# Patient Record
Sex: Male | Born: 1985 | Race: White | Hispanic: No | Marital: Married | Smoking: Never smoker
Health system: Southern US, Community
[De-identification: ages and names within clinical notes are randomized; demographics above are authoritative.]

## PROBLEM LIST (undated history)

## (undated) ENCOUNTER — Emergency Department (HOSPITAL_COMMUNITY): Admission: EM | Payer: Self-pay | Source: Home / Self Care

## (undated) DIAGNOSIS — G589 Mononeuropathy, unspecified: Secondary | ICD-10-CM

## (undated) DIAGNOSIS — E119 Type 2 diabetes mellitus without complications: Secondary | ICD-10-CM

## (undated) HISTORY — DX: Type 2 diabetes mellitus without complications: E11.9

---

## 2005-08-27 ENCOUNTER — Emergency Department (HOSPITAL_COMMUNITY): Admission: EM | Admit: 2005-08-27 | Discharge: 2005-08-27 | Payer: Self-pay | Admitting: Family Medicine

## 2012-10-20 ENCOUNTER — Emergency Department (HOSPITAL_COMMUNITY): Payer: No Typology Code available for payment source

## 2012-10-20 ENCOUNTER — Encounter (HOSPITAL_COMMUNITY): Payer: Self-pay

## 2012-10-20 ENCOUNTER — Emergency Department (HOSPITAL_COMMUNITY)
Admission: EM | Admit: 2012-10-20 | Discharge: 2012-10-20 | Disposition: A | Discharge status: Home / Self Care | Payer: No Typology Code available for payment source | Attending: Emergency Medicine | Admitting: Emergency Medicine

## 2012-10-20 DIAGNOSIS — R51 Headache: Secondary | ICD-10-CM | POA: Insufficient documentation

## 2012-10-20 DIAGNOSIS — Y9389 Activity, other specified: Secondary | ICD-10-CM | POA: Insufficient documentation

## 2012-10-20 DIAGNOSIS — R110 Nausea: Secondary | ICD-10-CM | POA: Insufficient documentation

## 2012-10-20 DIAGNOSIS — S199XXA Unspecified injury of neck, initial encounter: Secondary | ICD-10-CM | POA: Insufficient documentation

## 2012-10-20 DIAGNOSIS — Y92410 Unspecified street and highway as the place of occurrence of the external cause: Secondary | ICD-10-CM | POA: Insufficient documentation

## 2012-10-20 DIAGNOSIS — S0993XA Unspecified injury of face, initial encounter: Secondary | ICD-10-CM | POA: Insufficient documentation

## 2012-10-20 MED ORDER — HYDROCODONE-ACETAMINOPHEN 5-325 MG PO TABS: 1.0000 | ORAL_TABLET | Freq: Four times a day (QID) | ORAL | Status: DC | PRN

## 2012-10-20 MED ORDER — CYCLOBENZAPRINE HCL 10 MG PO TABS: 10.0000 mg | ORAL_TABLET | Freq: Three times a day (TID) | ORAL | Status: DC | PRN
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
## 2012-10-20 NOTE — ED Notes (Signed)
C-Collar applied

## 2012-10-21 NOTE — ED Provider Notes (Signed)
Medical screening examination/treatment/procedure(s) were performed by non-physician practitioner and as supervising physician I was immediately available for consultation/collaboration.   Vaishnavi Dalby III, MD 10/21/12 1234 

## 2013-10-19 ENCOUNTER — Emergency Department (HOSPITAL_COMMUNITY)
Admission: EM | Admit: 2013-10-19 | Discharge: 2013-10-19 | Disposition: A | Discharge status: Home / Self Care | Payer: Self-pay | Attending: Emergency Medicine | Admitting: Emergency Medicine

## 2013-10-19 ENCOUNTER — Emergency Department (HOSPITAL_COMMUNITY): Payer: Self-pay

## 2013-10-19 DIAGNOSIS — R209 Unspecified disturbances of skin sensation: Secondary | ICD-10-CM | POA: Insufficient documentation

## 2013-10-19 DIAGNOSIS — R0602 Shortness of breath: Secondary | ICD-10-CM | POA: Insufficient documentation

## 2013-10-19 DIAGNOSIS — R0789 Other chest pain: Secondary | ICD-10-CM | POA: Insufficient documentation

## 2013-10-19 LAB — BASIC METABOLIC PANEL
BUN: 16 mg/dL (ref 6–23)
CO2: 22 mEq/L (ref 19–32)
CREATININE: 1.28 mg/dL (ref 0.50–1.35)
Calcium: 9.2 mg/dL (ref 8.4–10.5)
Chloride: 102 mEq/L (ref 96–112)
GFR, EST AFRICAN AMERICAN: 86 mL/min — AB (ref >90)
GFR, EST NON AFRICAN AMERICAN: 74 mL/min — AB (ref >90)
Glucose, Bld: 119 mg/dL — ABNORMAL HIGH (ref 70–99)
Potassium: 3.7 mEq/L (ref 3.7–5.3)
Sodium: 139 mEq/L (ref 137–147)

## 2013-10-19 LAB — CBC
HCT: 43.5 % (ref 39.0–52.0)
Hemoglobin: 15.5 g/dL (ref 13.0–17.0)
MCH: 31 pg (ref 26.0–34.0)
MCHC: 35.6 g/dL (ref 30.0–36.0)
MCV: 87 fL (ref 78.0–100.0)
Platelets: 224 10*3/uL (ref 150–400)
RBC: 5 MIL/uL (ref 4.22–5.81)
RDW: 12.7 % (ref 11.5–15.5)
WBC: 8.8 10*3/uL (ref 4.0–10.5)

## 2013-10-19 LAB — I-STAT TROPONIN, ED: Troponin i, poc: 0 ng/mL (ref 0.00–0.08)

## 2013-10-19 MED ORDER — ASPIRIN 81 MG PO CHEW
324.0000 mg | CHEWABLE_TABLET | Freq: Once | ORAL | Status: AC
  Administered 2013-10-19: 324 mg via ORAL
  Filled 2013-10-19: qty 4

## 2013-10-19 MED ORDER — NAPROXEN 500 MG PO TABS: 500.0000 mg | ORAL_TABLET | Freq: Two times a day (BID) | ORAL | Status: DC

## 2013-10-19 NOTE — ED Provider Notes (Signed)
Medical screening examination/treatment/procedure(s) were conducted as a shared visit with non-physician practitioner(s) or resident  and myself.  I personally evaluated the patient during the encounter and agree with the findings and plan unless otherwise indicated.    I have personally reviewed any xrays and/ or EKG's with the provider and I agree with interpretation.   28 year old male with no significant medical history, very low risk cardiac, very low risk PE. Atypical symptoms intermittent for months. Patient well appearing on exam, no chest pain currently, lungs clear, regular rate and rhythm. EKG revealed no acute findings. Discussed close followup outpatient with primary care physician, patient may need stress test if symptoms continue.    EKG Interpretation   Date/Time:  Sunday October 19 2013 03:33:36 EDT Ventricular Rate:  80 PR Interval:  162 QRS Duration: 92 QT Interval:  363 QTC Calculation: 419 R Axis:   19 Text Interpretation:  Sinus rhythm Confirmed by Jodi MourningZAVITZ  MD, Nikolaos Maddocks ([id number])  on 10/19/2013 5:16:40 AM       Chest pain atypical  Enid SkeensJoshua M Nashalie Sallis, MD 10/19/13 [phone]

## 2013-10-19 NOTE — ED Notes (Signed)
Lt. Sided cp intermittently for 4 days. States, "been going on for a while."

## 2013-10-19 NOTE — ED Provider Notes (Signed)
CSN: [id number]     Arrival date & time 10/19/13  [id number] History   First MD Initiated Contact with Patient 10/19/13 [id number]     Chief Complaint  Patient presents with  . Chest Pain     (Consider location/radiation/quality/duration/timing/severity/associated sxs/prior Treatment) HPI Comments: Patient is a 28 year old male who presents to the emergency department for chest pain. Patient states that he has been experiencing a dull left-sided chest pain constantly for the past year. He states that chest pain has intermittently worsened over the past 4 days with most of his worsening in symptoms occurring since 7 PM yesterday evening. Patient describes the pain since this time to be pressure-like and burning. He states that the pain intermittently radiates to his left shoulder. He states that he has some associated tingling in his left arm, but that it has been "tingling for many days". Patient did not take anything for her symptoms prior to arrival. He states that onset was while at rest after eating dinner. Symptoms associated with SOB. Patient denies associated fever, syncope, lightheadedness, cough or hemoptysis, vomiting, abdominal, extremity numbness, or weakness. Patient denies a history of ACS, diabetes, hypertension, hyperlipidemia, DVT/PE, and coagulopathies. He denies a family history of ACS. He denies recent trauma or injury to the area, surgeries or hospitalizations, and prolonged travel. Patient gave 324mg  ASA on arrival with some improvement in chest pain.  Patient is a 28 y.o. male presenting with chest pain. The history is provided by the patient. No language interpreter was used.  Chest Pain Associated symptoms: shortness of breath   Associated symptoms: no palpitations     No past medical history on file. No past surgical history on file. No family history on file. History  Substance Use Topics  . Smoking status: Never Smoker   . Smokeless tobacco: Current User    Types: Snuff  .  Alcohol Use: Yes    Review of Systems  Respiratory: Positive for shortness of breath.   Cardiovascular: Positive for chest pain. Negative for palpitations.  Neurological:       +tingling LUE  All other systems reviewed and are negative.      Allergies  Review of patient's allergies indicates no known allergies.  Home Medications   Current Outpatient Rx  Name  Route  Sig  Dispense  Refill  . cyclobenzaprine (FLEXERIL) 10 MG tablet   Oral   Take 1 tablet (10 mg total) by mouth 3 (three) times daily as needed for muscle spasms.   30 tablet   0   . HYDROcodone-acetaminophen (NORCO/VICODIN) 5-325 MG per tablet   Oral   Take 1 tablet by mouth every 6 (six) hours as needed for pain.   15 tablet   0    BP 142/88  Pulse 79  Temp(Src) 98.7 F (37.1 C) (Oral)  Resp 20  Ht 5\' 7"  (1.702 m)  Wt 275 lb (124.739 kg)  BMI 43.06 kg/m2  SpO2 100%  Physical Exam  Nursing note and vitals reviewed. Constitutional: He is oriented to person, place, and time. He appears well-developed and well-nourished. No distress.  HENT:  Head: Normocephalic and atraumatic.  Mouth/Throat: Oropharynx is clear and moist. No oropharyngeal exudate.  Eyes: Conjunctivae and EOM are normal. No scleral icterus.  Neck: Normal range of motion. Neck supple.  Cardiovascular: Normal rate, regular rhythm, normal heart sounds and intact distal pulses.   No JVD  Pulmonary/Chest: Effort normal and breath sounds normal. No respiratory distress. He has no wheezes. He has no rales.  He exhibits tenderness.  L upper chest, medial to L shoulder. No tachypnea, retractions, or accessory muscle use. No stridor.  Abdominal: Soft. He exhibits no distension. There is no tenderness. There is no rebound and no guarding.  Musculoskeletal: Normal range of motion.  Neurological: He is alert and oriented to person, place, and time.  GCS 15. Speech is goal oriented. Patient moves extremities without ataxia.  Skin: Skin is warm and  dry. No rash noted. He is not diaphoretic. No erythema. No pallor.  Psychiatric: He has a normal mood and affect. His behavior is normal.    ED Course  Procedures (including critical care time) Labs Review Labs Reviewed  BASIC METABOLIC PANEL - Abnormal; Notable for the following:    Glucose, Bld 119 (*)    GFR calc non Af Amer 74 (*)    GFR calc Af Amer 86 (*)    All other components within normal limits  CBC  I-STAT TROPOININ, ED   Imaging Review Dg Chest 2 View  10/19/2013   CLINICAL DATA:  Shortness of breath.  EXAM: CHEST  2 VIEW  COMPARISON:  None.  FINDINGS: Normal heart size and mediastinal contours. No acute infiltrate or edema. No effusion or pneumothorax. No acute osseous findings.  IMPRESSION: No active cardiopulmonary disease.   Electronically Signed   By: Tiburcio Pea M.D.   On: 10/19/2013 04:37     EKG Interpretation   Date/Time:  Sunday October 19 2013 03:33:36 EDT Ventricular Rate:  80 PR Interval:  162 QRS Duration: 92 QT Interval:  363 QTC Calculation: 419 R Axis:   19 Text Interpretation:  Sinus rhythm Confirmed by ZAVITZ  MD, JOSHUA ([id number])  on 10/19/2013 5:16:40 AM      MDM   Final diagnoses:  Chest pain, atypical   28 year old male with no significant past medical history presents for chest pain. Patient states he has had chest pain constantly over the past year this has been worsening over the last 4 days. Most recent worsening of symptoms began at 7 PM yesterday. Patient well and nontoxic appearing, hemodynamically stable, and afebrile. Physical exam unremarkable today. Cardiac workup negative. That ACS given atypical nature of symptoms, chronicity of pain, and reassuring workup today. Patient also has a Heart Score of 1 c/w low risk of MACE. Doubt PE given lack of tachycardia, tachypnea, dyspnea, and hypoxia. Patient is PERC negative.   Do not believe further emergent workup is indicated at this time. Patient stable and appropriate for discharge  with cardiology followup for further evaluation of symptoms and scheduling of outpatient stress test. Resource guide provided as well as a prescription for naproxen. Return precautions discussed and patient agreeable to plan with no unaddressed concerns.    Filed Vitals:   10/19/13 0338  BP: 142/88  Pulse: 79  Temp: 98.7 F (37.1 C)  TempSrc: Oral  Resp: 20  Height: 5\' 7"  (1.702 m)  Weight: 275 lb (124.739 kg)  SpO2: 100%       Antony Madura, PA-C 10/19/13 [phone]

## 2013-10-19 NOTE — ED Notes (Signed)
Chest pain resolved

## 2013-10-19 NOTE — Discharge Instructions (Signed)
Take Naproxen as prescribed for pain. Follow up with cardiology to discuss your symptoms and to schedule an outpatient stress test. Follow up with a primary care provider; refer to the resource guide below if you do not have a primary doctor. Return if symptoms worsen.  Chest Pain (Nonspecific) It is often hard to give a specific diagnosis for the cause of chest pain. There is always a chance that your pain could be related to something serious, such as a heart attack or a blood clot in the lungs. You need to follow up with your caregiver for further evaluation. CAUSES   Heartburn.  Pneumonia or bronchitis.  Anxiety or stress.  Inflammation around your heart (pericarditis) or lung (pleuritis or pleurisy).  A blood clot in the lung.  A collapsed lung (pneumothorax). It can develop suddenly on its own (spontaneous pneumothorax) or from injury (trauma) to the chest.  Shingles infection (herpes zoster virus). The chest wall is composed of bones, muscles, and cartilage. Any of these can be the source of the pain.  The bones can be bruised by injury.  The muscles or cartilage can be strained by coughing or overwork.  The cartilage can be affected by inflammation and become sore (costochondritis). DIAGNOSIS  Lab tests or other studies, such as X-rays, electrocardiography, stress testing, or cardiac imaging, may be needed to find the cause of your pain.  TREATMENT   Treatment depends on what may be causing your chest pain. Treatment may include:  Acid blockers for heartburn.  Anti-inflammatory medicine.  Pain medicine for inflammatory conditions.  Antibiotics if an infection is present.  You may be advised to change lifestyle habits. This includes stopping smoking and avoiding alcohol, caffeine, and chocolate.  You may be advised to keep your head raised (elevated) when sleeping. This reduces the chance of acid going backward from your stomach into your esophagus.  Most of the  time, nonspecific chest pain will improve within 2 to 3 days with rest and mild pain medicine. HOME CARE INSTRUCTIONS   If antibiotics were prescribed, take your antibiotics as directed. Finish them even if you start to feel better.  For the next few days, avoid physical activities that bring on chest pain. Continue physical activities as directed.  Do not smoke.  Avoid drinking alcohol.  Only take over-the-counter or prescription medicine for pain, discomfort, or fever as directed by your caregiver.  Follow your caregiver's suggestions for further testing if your chest pain does not go away.  Keep any follow-up appointments you made. If you do not go to an appointment, you could develop lasting (chronic) problems with pain. If there is any problem keeping an appointment, you must call to reschedule. SEEK MEDICAL CARE IF:   You think you are having problems from the medicine you are taking. Read your medicine instructions carefully.  Your chest pain does not go away, even after treatment.  You develop a rash with blisters on your chest. SEEK IMMEDIATE MEDICAL CARE IF:   You have increased chest pain or pain that spreads to your arm, neck, jaw, back, or abdomen.  You develop shortness of breath, an increasing cough, or you are coughing up blood.  You have severe back or abdominal pain, feel nauseous, or vomit.  You develop severe weakness, fainting, or chills.  You have a fever. THIS IS AN EMERGENCY. Do not wait to see if the pain will go away. Get medical help at once. Call your local emergency services (911 in U.S.). Do not  drive yourself to the hospital. MAKE SURE YOU:   Understand these instructions.  Will watch your condition.  Will get help right away if you are not doing well or get worse. Document Released: 04/19/2005 Document Revised: 10/02/2011 Document Reviewed: 02/13/2008 [facility] Patient Information 2014 [place], [place].  Emergency Department Resource  Guide 1) Find a Doctor and Pay Out of Pocket Although you won't have to find out who is covered by your insurance plan, it is a good idea to ask around and get recommendations. You will then need to call the office and see if the doctor you have chosen will accept you as a new patient and what types of options they offer for patients who are self-pay. Some doctors offer discounts or will set up payment plans for their patients who do not have insurance, but you will need to ask so you aren't surprised when you get to your appointment.  2) Contact Your Local Health Department Not all health departments have doctors that can see patients for sick visits, but many do, so it is worth a call to see if yours does. If you don't know where your local health department is, you can check in your phone book. The CDC also has a tool to help you locate your state's health department, and many state websites also have listings of all of their local health departments.  3) Find a Walk-in Clinic If your illness is not likely to be very severe or complicated, you may want to try a walk in clinic. These are popping up all over the country in pharmacies, drugstores, and shopping centers. They're usually staffed by nurse practitioners or physician assistants that have been trained to treat common illnesses and complaints. They're usually fairly quick and inexpensive. However, if you have serious medical issues or chronic medical problems, these are probably not your best option.  No Primary Care Doctor: - Call Health Connect at  [phone] - they can help you locate a primary care doctor that  accepts your insurance, provides certain services, etc. - Physician Referral Service- [phone]  Chronic Pain Problems: Organization         Address  Phone   Notes  [facility]  [phone] Patients need to be referred by their primary care doctor.   Medication Assistance: Organization          Address  Phone   Notes  [facility] Medication [facility] [address] [place] [place], [place] [address] [phone] --Must be a resident of [facility] -- Must have NO insurance coverage whatsoever (no Medicaid/ Medicare, etc.) -- The pt. MUST have a primary care doctor that directs their care regularly and follows them in the community   Med[facility]ist  [phone]   [employer]  [phone]    Agencies that provide inexpensive medical care: Organization         Address  Phone   Notes  [facility]  [phone]   [facility]    [phone]   [facility] [address] [place], [place] [address] [phone]   [facility] [address] [place], [place] [phone]   Planned Parenthood    [phone]   [facility]    [phone]   Community [facility] and [facility]  [address], [place] Phone:  [phone], Fax:  [phone] Hours of Operation:  9 am -  6 pm, M-F.  Also accepts Medicaid/Medicare and self-pay.  [facility]  [address], [place] Phone: [phone](336) [phone], Fax: [phone]. Hours of Operation:  8:30 am - 5:30 pm, M-F.  Also accepts Medicaid and self-pay.  [facility] [place] [address], [place] Phone: [phone](336) [phone]   [facility] [address], [place], [place] [phone], Ext. [phone] Mondays & Thursdays: 7-9 AM.  First 15 patients are seen on a first come, first serve basis.    Medicaid-accepting [facility] Providers:  Organization         Address  Phone   Notes  [facility] [address], [place] [phone](336) [phone] Also accepts self-pay patients.  [facility] [address] [place], [place]  [phone]) [phone]   [facility] [address], [place] [phone](336) [phone]   [facility] [address] [place] [address], [place] [phone]) [phone]   Renaye RakersVeita Bland [address], [place]   [phone]) [phone] Only accepts [place] Access [place] patients after they have their name applied to their card.   Self-Pay (no insurance) in [facility]:  Organization         Address  Phone   Notes  Sickle Cell Patients, [facility] [address], [place] [phone](336) [phone]   [facility] [address], [place] [phone]) [phone]   [facility]  [address], [place] [phone]) [phone]   [facility]/Dr. Osei-Bonsu  [address] [place] [address], [place] or [phone], [place] [phone]) [phone] Phone number for both [place] and [place] locations is the same.  [facility] [address], [place] [phone]) [phone]   [facility] [address] [place] [address], [place] or [address] [phone]   [facility] [address], [place] [phone](336) [phone], phone; [phone](336) [phone], fax Sees patients 1st and 3rd Saturday of every month.  Must not qualify for public or private insurance (i.e. Medicaid, Medicare, [place] Health Choice, Veterans' Benefits)  Household income should be no more than 200% of the poverty level The clinic cannot treat you if you are pregnant or think you are pregnant  Sexually transmitted diseases are not treated at the clinic.    Dental Care: Organization         Address  Phone  Notes  [facility] [address], [place] [phone] Accepts children up to age 30 who are enrolled in [place] or [place] Health Choice; pregnant women with a Medicaid card; and children who have applied for Medicaid or [place] Health Choice, but were declined, whose parents can pay a reduced fee at time of service.  [facility] of [facility]  [place]  [address], [place] [phone]) [phone] Accepts children up to age 30 who are enrolled in [place] or [place] Health Choice; pregnant women with a Medicaid card; and children who have applied for Medicaid or [place] Health Choice, but were declined, whose parents can pay a reduced fee at time of service.  [facility]  [address], [place] [phone] Patients are seen by appointment only. Walk-ins are not accepted. [facility] will see patients 30 years of age and  older. Monday - Tuesday (8am-5pm) Most Wednesdays (8:30-5pm) $30 per visit, cash only  [facility] [employer] PROGRAM  [address], [facility] [phone] Patients are seen by appointment only. Walk-ins are not accepted. [facility] will see patients 63 years of age and older. One Wednesday Evening (Monthly: Volunteer Based).  $30 per visit, cash only  [employer]  [phone] for adults; Children under age 36, call Graduate Pediatric Dentistry at [phone]. Children aged 45-14, please call [phone] to request a pediatric application.  Dental services are provided in all areas of dental care including fillings, crowns and bridges, complete and partial dentures, implants, gum treatment, root canals, and extractions. Preventive care is also provided. Treatment is provided to both adults and children. Patients are selected via a lottery and there is often a waiting list.   [facility] [address] [place]  [phone] [email or web address]   [facility] [address] [place], [place] [phone], Ext. [phone] Second and Fourth Thursday of each month, opens at 6:30 AM; Clinic ends at 9 AM.  Patients are seen on a first-come first-served basis, and a limited number are seen during each clinic.   [facility]  [address] [place], [place] [phone]   Eligibility Requirements You must  have lived in [place], [place], or [place] counties for at least the last three months.   You cannot be eligible for state or federal sponsored [place], including [employer], [place], or [employer].   You generally cannot be eligible for healthcare insurance through your employer.    How to apply: Eligibility screenings are held every Tuesday and Wednesday afternoon from 1:00 pm until 4:00 pm. You do not need an appointment for the interview!  [facility] [address] [place], [place] [phone]   [facility]  [phone]   [facility]  [phone]   [facility]  [phone]    Behavioral Health Resources in the Community: Intensive Outpatient Programs Organization         Address  Phone  Notes  [facility] [address], [place], [place] [phone]   [facility] Outpatient [address] [place], [place] [phone]   [facility] [address] [place], [place]  [phone]   [facility] [address]  [place], [place] [phone] or [phone]   Substance Abuse Resources Organization         Address  Phone  Notes  [facility] Drug Services  [phone]   [facility]  [phone]   [facility] [place]  [phone]   Floydene Flock  [phone]   Residential & Outpatient Substance Abuse Program  [phone]   Psychological Services Organization         Address  Phone  Notes  [facility]  [phone]   [facility]  [phone]   [facility] Mental Health [address] [place] [phone] or [phone]    Mobile Crisis Teams Organization         Address  Phone  Notes  [facility], Mobile Crisis Care Unit  [phone]   [facility]  [address] [place], [place] [phone]   [facility] [address] [place] [phone]    Self-Help/Support Groups Organization         Address  Phone             Notes  [facility] [place] - variety of support groups  [phone]- I7437963 Call for more information  Narcotics Anonymous (NA), [facility] [address], [employer] [place]  2 meetings at this location   Statistician         Address  Phone  Notes  [facility] [address],    [place]  [phone]   [facility]  [address], [place] [address], [place], [place] [phone]   [facility] [address] [place], [place] [phone] Admissions: 8am-3pm M-F  [facility] [address],    [place], [place] [phone]   [facility] [address] [place], [place], [place] [phone]   [facility] [address]  [place], [place] [phone]   [facility] - Intensive Outpatient [address], [place], [place] [phone]   [facility] [address] [place].,  [place], [place] [phone] or [phone]   [facility] ([facility]) [address], [place], [place] [phone] Accepts Medicaid  [facility] [address],  [place] [phone] Substance Abuse/Addiction Treatment   [facility] Organization         Address  Phone  Notes  [facility]  [phone]   Angie Fava, PhD [address] [place], [place]   [phone] or [phone]   [facility]   [address] [place], [place] [phone]   [facility] [address] [place], [place] [phone] Insurance/Medicaid/sponsorship through [facility] [address]                                    [place], [place] [phone] Therapy/tele-psych/case  [facility] [address], [place] [phone]    Dr. Lolly Mustache  [phone]   [facility]  [employer] [facility] Dept. 1) [address]  2) [address], [place] 3)  [address], [place] [phone]  [phone]   [facility] Child Abuse Hotline 660-071-0701 or [phone] (After Hours)

## 2013-10-19 NOTE — ED Notes (Signed)
Taken straight back for EKG

## 2014-09-24 ENCOUNTER — Emergency Department (HOSPITAL_COMMUNITY)
Admission: EM | Admit: 2014-09-24 | Discharge: 2014-09-24 | Disposition: A | Discharge status: Home / Self Care | Payer: Self-pay | Attending: Emergency Medicine | Admitting: Emergency Medicine

## 2014-09-24 ENCOUNTER — Emergency Department (HOSPITAL_COMMUNITY): Payer: Self-pay

## 2014-09-24 ENCOUNTER — Encounter (HOSPITAL_COMMUNITY): Payer: Self-pay | Admitting: *Deleted

## 2014-09-24 DIAGNOSIS — R059 Cough, unspecified: Secondary | ICD-10-CM

## 2014-09-24 DIAGNOSIS — Z791 Long term (current) use of non-steroidal anti-inflammatories (NSAID): Secondary | ICD-10-CM | POA: Insufficient documentation

## 2014-09-24 DIAGNOSIS — R05 Cough: Secondary | ICD-10-CM

## 2014-09-24 DIAGNOSIS — Z720 Tobacco use: Secondary | ICD-10-CM | POA: Insufficient documentation

## 2014-09-24 DIAGNOSIS — J40 Bronchitis, not specified as acute or chronic: Secondary | ICD-10-CM | POA: Insufficient documentation

## 2014-09-24 DIAGNOSIS — F419 Anxiety disorder, unspecified: Secondary | ICD-10-CM | POA: Insufficient documentation

## 2014-09-24 LAB — CBC WITH DIFFERENTIAL/PLATELET
Basophils Absolute: 0 10*3/uL (ref 0.0–0.1)
Basophils Relative: 0 % (ref 0–1)
EOS ABS: 0 10*3/uL (ref 0.0–0.7)
EOS PCT: 0 % (ref 0–5)
HCT: 45.2 % (ref 39.0–52.0)
Hemoglobin: 15.4 g/dL (ref 13.0–17.0)
LYMPHS ABS: 0.8 10*3/uL (ref 0.7–4.0)
Lymphocytes Relative: 11 % — ABNORMAL LOW (ref 12–46)
MCH: 30 pg (ref 26.0–34.0)
MCHC: 34.1 g/dL (ref 30.0–36.0)
MCV: 87.9 fL (ref 78.0–100.0)
MONOS PCT: 10 % (ref 3–12)
Monocytes Absolute: 0.7 10*3/uL (ref 0.1–1.0)
NEUTROS PCT: 79 % — AB (ref 43–77)
Neutro Abs: 5.3 10*3/uL (ref 1.7–7.7)
PLATELETS: 169 10*3/uL (ref 150–400)
RBC: 5.14 MIL/uL (ref 4.22–5.81)
RDW: 13.1 % (ref 11.5–15.5)
WBC: 6.8 10*3/uL (ref 4.0–10.5)

## 2014-09-24 LAB — BASIC METABOLIC PANEL
Anion gap: 8 (ref 5–15)
BUN: 11 mg/dL (ref 6–23)
CALCIUM: 8.9 mg/dL (ref 8.4–10.5)
CO2: 23 mmol/L (ref 19–32)
Chloride: 105 mmol/L (ref 96–112)
Creatinine, Ser: 1.14 mg/dL (ref 0.50–1.35)
GFR calc Af Amer: >90 mL/min (ref >90)
GFR, EST NON AFRICAN AMERICAN: 85 mL/min — AB (ref >90)
Glucose, Bld: 114 mg/dL — ABNORMAL HIGH (ref 70–99)
POTASSIUM: 3.4 mmol/L — AB (ref 3.5–5.1)
Sodium: 136 mmol/L (ref 135–145)

## 2014-09-24 MED ORDER — ONDANSETRON HCL 4 MG/2ML IJ SOLN
4.0000 mg | Freq: Once | INTRAMUSCULAR | Status: AC
  Administered 2014-09-24: 4 mg via INTRAVENOUS
  Filled 2014-09-24: qty 2

## 2014-09-24 MED ORDER — PREDNISONE 20 MG PO TABS: 40.0000 mg | ORAL_TABLET | Freq: Every day | ORAL | Status: DC

## 2014-09-24 MED ORDER — HYDROCOD POLST-CHLORPHEN POLST 10-8 MG/5ML PO LQCR
5.0000 mL | Freq: Once | ORAL | Status: AC
  Administered 2014-09-24: 5 mL via ORAL
  Filled 2014-09-24: qty 5

## 2014-09-24 MED ORDER — ALBUTEROL SULFATE HFA 108 (90 BASE) MCG/ACT IN AERS: 1.0000 | INHALATION_SPRAY | Freq: Four times a day (QID) | RESPIRATORY_TRACT | Status: DC | PRN

## 2014-09-24 MED ORDER — KETOROLAC TROMETHAMINE 30 MG/ML IJ SOLN
30.0000 mg | Freq: Once | INTRAMUSCULAR | Status: AC
  Administered 2014-09-24: 30 mg via INTRAVENOUS
  Filled 2014-09-24: qty 1

## 2014-09-24 MED ORDER — SODIUM CHLORIDE 0.9 % IV SOLN
INTRAVENOUS | Status: DC
  Administered 2014-09-24: 15:00:00 via INTRAVENOUS

## 2014-09-24 MED ORDER — ALBUTEROL SULFATE (2.5 MG/3ML) 0.083% IN NEBU
5.0000 mg | INHALATION_SOLUTION | Freq: Once | RESPIRATORY_TRACT | Status: AC
  Administered 2014-09-24: 5 mg via RESPIRATORY_TRACT
  Filled 2014-09-24: qty 6

## 2014-09-24 MED ORDER — IPRATROPIUM BROMIDE 0.02 % IN SOLN
0.5000 mg | Freq: Once | RESPIRATORY_TRACT | Status: AC
  Administered 2014-09-24: 0.5 mg via RESPIRATORY_TRACT
  Filled 2014-09-24: qty 2.5

## 2014-09-24 MED ORDER — HYDROCODONE-ACETAMINOPHEN 7.5-325 MG/15ML PO SOLN: 15.0000 mL | Freq: Four times a day (QID) | ORAL | Status: DC | PRN

## 2014-09-24 MED ORDER — SODIUM CHLORIDE 0.9 % IV BOLUS (SEPSIS)
1000.0000 mL | Freq: Once | INTRAVENOUS | Status: AC
  Administered 2014-09-24: 1000 mL via INTRAVENOUS

## 2014-09-24 MED ORDER — LORAZEPAM 2 MG/ML IJ SOLN
1.0000 mg | Freq: Once | INTRAMUSCULAR | Status: AC
  Administered 2014-09-24: 1 mg via INTRAVENOUS
  Filled 2014-09-24: qty 1

## 2014-09-24 NOTE — ED Provider Notes (Signed)
CSN: [id number]     Arrival date & time 09/24/14  1338 History   First MD Initiated Contact with Patient 09/24/14 1349     Chief Complaint  Patient presents with  . Shortness of Breath  . Cough  . Nasal Congestion     (Consider location/radiation/quality/duration/timing/severity/associated sxs/prior Treatment) HPI Comments: Patient here with cough and shortness of breath with wheezing along with fever 4 days. Positive exposure to influenza by his girlfriend. Has had diarrhea as well as nonbilious vomiting. Has been using over-the-counter medications without resolve. Denies any urinary symptoms. No CHF symptoms. No headache, neck pain, photophobia. Symptoms have been gradually worsening. Nothing makes them better  Patient is a 29 y.o. male presenting with shortness of breath and cough. The history is provided by the patient.  Shortness of Breath Associated symptoms: cough   Cough Associated symptoms: shortness of breath     History reviewed. No pertinent past medical history. History reviewed. No pertinent past surgical history. No family history on file. History  Substance Use Topics  . Smoking status: Current Some Day Smoker  . Smokeless tobacco: Current User    Types: Snuff  . Alcohol Use: Yes    Review of Systems  Respiratory: Positive for cough and shortness of breath.   All other systems reviewed and are negative.     Allergies  Review of patient's allergies indicates no known allergies.  Home Medications   Prior to Admission medications   Medication Sig Start Date End Date Taking? Authorizing Provider  naproxen (NAPROSYN) 500 MG tablet Take 1 tablet (500 mg total) by mouth 2 (two) times daily. 10/19/13   Antony Madura, PA-C   BP 116/98 mmHg  Pulse 119  Temp(Src) 99.2 F (37.3 C) (Oral)  Resp 23  SpO2 98% Physical Exam  Constitutional: He is oriented to person, place, and time. He appears well-developed and well-nourished.  Non-toxic appearance. No distress.   HENT:  Head: Normocephalic and atraumatic.  Eyes: Conjunctivae, EOM and lids are normal. Pupils are equal, round, and reactive to light.  Neck: Normal range of motion. Neck supple. No tracheal deviation present. No thyroid mass present.  Cardiovascular: Normal rate, regular rhythm and normal heart sounds.  Exam reveals no gallop.   No murmur heard. Pulmonary/Chest: Effort normal. No stridor. No respiratory distress. He has decreased breath sounds. He has wheezes. He has no rhonchi. He has no rales.  Abdominal: Soft. Normal appearance and bowel sounds are normal. He exhibits no distension. There is no tenderness. There is no rebound and no CVA tenderness.  Musculoskeletal: Normal range of motion. He exhibits no edema or tenderness.  Neurological: He is alert and oriented to person, place, and time. He has normal strength. No cranial nerve deficit or sensory deficit. GCS eye subscore is 4. GCS verbal subscore is 5. GCS motor subscore is 6.  Skin: Skin is warm and dry. No abrasion and no rash noted.  Psychiatric: His speech is normal and behavior is normal. His mood appears anxious.  Nursing note and vitals reviewed.   ED Course  Procedures (including critical care time) Labs Review Labs Reviewed  CBC WITH DIFFERENTIAL/PLATELET  BASIC METABOLIC PANEL    Imaging Review Dg Chest 2 View  09/24/2014   CLINICAL DATA:  Three-day history of shortness of breath and fever  EXAM: CHEST  2 VIEW  COMPARISON:  October 19, 2013  FINDINGS: Lungs are clear. Heart size and pulmonary vascularity are normal. No adenopathy. No pneumothorax. No bone lesions.  IMPRESSION: No  edema or consolidation.   Electronically Signed   By: Bretta BangWilliam  Woodruff III M.D.   On: 09/24/2014 14:04     EKG Interpretation None      MDM   Final diagnoses:  Cough    Patient given albuterol along with same as on Atrovent. He was very anxious and given Ativan as well as cough medication. Chest x-ray was negative for infiltrate.  He feels much better after the medications. Repeat lung exam shows good air movement. Patient likely with bronchitis. No concern for pulmonary embolism or ACS. Stable for discharge    Toy BakerAnthony T Maliki Gignac, MD 09/24/14 1531

## 2014-09-24 NOTE — Discharge Instructions (Signed)

## 2014-09-24 NOTE — ED Notes (Signed)
Patient complains of chest tightness with breath, cough, congestion and fever for 4 days. Patient is afebrile right now. Patient has history of panic and anxiety. He became very labored in breathing while being brought back. Patient is now calming down.

## 2014-09-24 NOTE — ED Notes (Signed)
Pt had a ride at DC.

## 2014-09-24 NOTE — ED Notes (Signed)
Dr. Freida BusmanAllen made aware of pt's HR at discharge.

## 2014-09-29 ENCOUNTER — Emergency Department (HOSPITAL_COMMUNITY): Payer: Self-pay

## 2014-09-29 ENCOUNTER — Encounter (HOSPITAL_COMMUNITY): Payer: Self-pay | Admitting: *Deleted

## 2014-09-29 ENCOUNTER — Emergency Department (HOSPITAL_COMMUNITY)
Admission: EM | Admit: 2014-09-29 | Discharge: 2014-09-29 | Disposition: A | Discharge status: Home / Self Care | Payer: Self-pay | Attending: Emergency Medicine | Admitting: Emergency Medicine

## 2014-09-29 DIAGNOSIS — R05 Cough: Secondary | ICD-10-CM | POA: Insufficient documentation

## 2014-09-29 DIAGNOSIS — Z720 Tobacco use: Secondary | ICD-10-CM | POA: Insufficient documentation

## 2014-09-29 DIAGNOSIS — K219 Gastro-esophageal reflux disease without esophagitis: Secondary | ICD-10-CM

## 2014-09-29 DIAGNOSIS — Z7952 Long term (current) use of systemic steroids: Secondary | ICD-10-CM | POA: Insufficient documentation

## 2014-09-29 DIAGNOSIS — R062 Wheezing: Secondary | ICD-10-CM | POA: Insufficient documentation

## 2014-09-29 DIAGNOSIS — R059 Cough, unspecified: Secondary | ICD-10-CM

## 2014-09-29 MED ORDER — ALBUTEROL SULFATE HFA 108 (90 BASE) MCG/ACT IN AERS: 2.0000 | INHALATION_SPRAY | RESPIRATORY_TRACT | Status: DC | PRN

## 2014-09-29 MED ORDER — OMEPRAZOLE 20 MG PO CPDR: 20.0000 mg | DELAYED_RELEASE_CAPSULE | Freq: Two times a day (BID) | ORAL | Status: DC

## 2014-09-29 MED ORDER — PREDNISONE 20 MG PO TABS
60.0000 mg | ORAL_TABLET | Freq: Once | ORAL | Status: AC
  Administered 2014-09-29: 60 mg via ORAL
  Filled 2014-09-29: qty 3

## 2014-09-29 MED ORDER — PANTOPRAZOLE SODIUM 40 MG PO TBEC
40.0000 mg | DELAYED_RELEASE_TABLET | Freq: Every day | ORAL | Status: DC
  Administered 2014-09-29: 40 mg via ORAL
  Filled 2014-09-29: qty 1

## 2014-09-29 MED ORDER — PREDNISONE 10 MG PO TABS: 10.0000 mg | ORAL_TABLET | Freq: Every day | ORAL | Status: DC

## 2014-09-29 MED ORDER — FAMOTIDINE IN NACL 20-0.9 MG/50ML-% IV SOLN
20.0000 mg | Freq: Once | INTRAVENOUS | Status: AC
  Administered 2014-09-29: 20 mg via INTRAVENOUS
  Filled 2014-09-29: qty 50

## 2014-09-29 MED ORDER — IPRATROPIUM-ALBUTEROL 0.5-2.5 (3) MG/3ML IN SOLN
3.0000 mL | Freq: Once | RESPIRATORY_TRACT | Status: AC
  Administered 2014-09-29: 3 mL via RESPIRATORY_TRACT
  Filled 2014-09-29: qty 3

## 2014-09-29 MED ORDER — AEROCHAMBER PLUS FLO-VU LARGE MISC
1.0000 | Freq: Once | Status: DC
  Filled 2014-09-29: qty 1

## 2014-09-29 NOTE — ED Provider Notes (Signed)
CSN: [id number]     Arrival date & time 09/29/14  1922 History  This chart was scribed for Earley Favor, NP working with Elwin Mocha, MD by Evon Slack, ED Scribe. This patient was seen in room WTR7/WTR7 and the patient's care was started at 9:13 PM.    Chief Complaint  Patient presents with  . Cough   The history is provided by the patient. No language interpreter was used.   HPI Comments: Mark Santos is a 29 y.o. male who presents to the Emergency Department complaining of progressively worsening chronic non productive cough onset 6 months prior. Pt states that the cough has recently worsened over the past week. Pt states that he has coughs up blood every morning. Pt states that he has tried cough medicine, prednisone and albuterol inhaler with no relief. Pt states that he is no longer a current everyday smoker but states that he smokes marijuana occasionally. Pt states that he does have a Hx of GERD. Pt states that he has a Hx of recurrent bronchitis. Pt denies Hx of asthma. Pt denies fever or other related symptoms.   History reviewed. No pertinent past medical history. History reviewed. No pertinent past surgical history. History reviewed. No pertinent family history. History  Substance Use Topics  . Smoking status: Current Some Day Smoker  . Smokeless tobacco: Current User    Types: Snuff  . Alcohol Use: Yes    Review of Systems  Constitutional: Negative for fever.  Respiratory: Positive for cough.   All other systems reviewed and are negative.   Allergies  Review of patient's allergies indicates no known allergies.  Home Medications   Prior to Admission medications   Medication Sig Start Date End Date Taking? Authorizing Provider  albuterol (PROVENTIL HFA;VENTOLIN HFA) 108 (90 BASE) MCG/ACT inhaler Inhale 1-2 puffs into the lungs every 6 (six) hours as needed for wheezing or shortness of breath. 09/24/14   Lorre Nick, MD  HYDROcodone-acetaminophen (HYCET) 7.5-325  mg/15 ml solution Take 15 mLs by mouth every 6 (six) hours as needed (Cough). 09/24/14 09/24/15  Lorre Nick, MD  naproxen (NAPROSYN) 500 MG tablet Take 1 tablet (500 mg total) by mouth 2 (two) times daily. Patient not taking: Reported on 09/24/2014 10/19/13   Antony Madura, PA-C  OVER THE COUNTER MEDICATION Patient states he uses his wife's inhaler when needed but can't remember the name of inhaler.    Historical Provider, MD  predniSONE (DELTASONE) 20 MG tablet Take 2 tablets (40 mg total) by mouth daily. 09/24/14   Lorre Nick, MD   BP 128/91 mmHg  Pulse 82  Temp(Src) 98.1 F (36.7 C) (Oral)  Resp 20  Ht  (1.676 m)  Wt 250 lb (113.399 kg)  BMI 40.37 kg/m2  SpO2 98%   Physical Exam  Constitutional: He appears well-developed and well-nourished.  HENT:  Head: Normocephalic.  Eyes: Pupils are equal, round, and reactive to light.  Neck: Normal range of motion.  Cardiovascular: Normal rate.   Pulmonary/Chest: Effort normal. He has wheezes. He has rales.  Musculoskeletal: Normal range of motion.  Neurological: He is alert.  Skin: Skin is warm.    ED Course  Procedures (including critical care time) DIAGNOSTIC STUDIES: Oxygen Saturation is 98% on RA, normal by my interpretation.    COORDINATION OF CARE: 9:35 PM-Discussed treatment plan with pt at bedside and pt agreed to plan.     Labs Review Labs Reviewed - No data to display  Imaging Review Dg Chest 2 View  09/29/2014   CLINICAL DATA:  7 days ago and dxd with bronchitis, was given steroids and cough medicine - pt states he has finished medication and feels no better - states he can feel rattling in his left chest - previous smoker - pt will not remove nipple rings,  EXAM: CHEST - 2 VIEW  COMPARISON:  None available  FINDINGS: Lungs are clear. Central peribronchial thickening as before. Heart size and mediastinal contours are within normal limits. No effusion. Visualized skeletal structures are unremarkable.  IMPRESSION: No acute  cardiopulmonary disease.   Electronically Signed   By: Corlis Leak  Hassell M.D.   On: 09/29/2014 20:04     EKG Interpretation None     It was given Pepcid IV, and albuterol, Atrovent nebulizer with little change in his symptoms.  He will be discharged home with a prescription for Prilosec 1 tablet twice a day for 7 days, then 1 tablet daily.  He also will be started on an additional steroid taper of 60 mg for 2 days, 50 mg for 2 days, 40 mg for 2 days, 30 mg for 2 days, 20 mg, 2 days and 10 mg for 2 days.  He will be given referral to GI as well as with our pulmonary for further evaluation MDM   Final diagnoses:  None           Earley FavorGail Bentley Haralson, NP 09/29/14 2314  Elwin MochaBlair Walden, MD 09/30/14 [phone]

## 2014-09-29 NOTE — ED Notes (Signed)
Pt reports he was seen at this facility on Thursday for cough/flu-like symptoms - pt reports persistent cough and states the cough is worse. Pt admits to non-productive cough however feels as though his chest is very congested. Denies any recent fevers. Cough has been present x6 months in duration.

## 2014-09-29 NOTE — Discharge Instructions (Signed)
Please make an appointment with both [facility] and [facility] Take the medication as directed

## 2016-03-21 ENCOUNTER — Emergency Department (HOSPITAL_COMMUNITY): Payer: Self-pay

## 2016-03-21 ENCOUNTER — Encounter (HOSPITAL_COMMUNITY): Payer: Self-pay

## 2016-03-21 ENCOUNTER — Emergency Department (HOSPITAL_COMMUNITY)
Admission: EM | Admit: 2016-03-21 | Discharge: 2016-03-21 | Disposition: A | Discharge status: Home / Self Care | Payer: Self-pay | Attending: Emergency Medicine | Admitting: Emergency Medicine

## 2016-03-21 DIAGNOSIS — Y999 Unspecified external cause status: Secondary | ICD-10-CM | POA: Insufficient documentation

## 2016-03-21 DIAGNOSIS — F17200 Nicotine dependence, unspecified, uncomplicated: Secondary | ICD-10-CM | POA: Insufficient documentation

## 2016-03-21 DIAGNOSIS — R200 Anesthesia of skin: Secondary | ICD-10-CM | POA: Insufficient documentation

## 2016-03-21 DIAGNOSIS — Y9389 Activity, other specified: Secondary | ICD-10-CM | POA: Insufficient documentation

## 2016-03-21 DIAGNOSIS — F17290 Nicotine dependence, other tobacco product, uncomplicated: Secondary | ICD-10-CM | POA: Insufficient documentation

## 2016-03-21 DIAGNOSIS — Z79899 Other long term (current) drug therapy: Secondary | ICD-10-CM | POA: Insufficient documentation

## 2016-03-21 DIAGNOSIS — X58XXXA Exposure to other specified factors, initial encounter: Secondary | ICD-10-CM | POA: Insufficient documentation

## 2016-03-21 DIAGNOSIS — S56911A Strain of unspecified muscles, fascia and tendons at forearm level, right arm, initial encounter: Secondary | ICD-10-CM | POA: Insufficient documentation

## 2016-03-21 DIAGNOSIS — Y92007 Garden or yard of unspecified non-institutional (private) residence as the place of occurrence of the external cause: Secondary | ICD-10-CM | POA: Insufficient documentation

## 2016-03-21 DIAGNOSIS — F1290 Cannabis use, unspecified, uncomplicated: Secondary | ICD-10-CM | POA: Insufficient documentation

## 2016-03-21 HISTORY — DX: Mononeuropathy, unspecified: G58.9

## 2016-03-21 MED ORDER — METHOCARBAMOL 500 MG PO TABS: 500.0000 mg | ORAL_TABLET | Freq: Two times a day (BID) | ORAL | 0 refills | Status: DC

## 2016-03-21 NOTE — ED Triage Notes (Addendum)
Pt c/o 5/10 right arm pain, weakness and numbness. Pt states he was using a Pick Axe recently at home. Pain reproducible upon palpation of right forearm. Pt presents w/ no neuro deficits. Pt denies HA. No slurred speech observed. Pt A+OX4, speaking in complete sentences.

## 2016-03-21 NOTE — ED Provider Notes (Signed)
[facility]-EMERGENCY DEPT Provider Note   CSN: [id number] Arrival date & time: 03/21/16  1930  By signing my name below, I, Javier Docker, attest that this documentation has been prepared under the direction and in the presence of Audry Pili, PA-C. Electronically Signed: Javier Docker, ER Scribe. 03/04/2016. 8:16 PM.   History   Chief Complaint Chief Complaint  Patient presents with  . Numbness    Right Arm    The history is provided by the patient. No language interpreter was used.    HPI Comments: Mark Santos is a 30 y.o. male who presents to the Emergency Department complaining of numbness and pain from the elbow to his hand for the last 10 days. His sx began while he was digging a hole with a pickax in his yard and hit a large rock. He has significant pain with gripping. When he wakes up in the morning his hand is completely numb, but resolves when he ices it. Able to hold objects without difficulty. He has been taking advil for pain. Denies pain currently. No other symptoms noted.   Past Medical History:  Diagnosis Date  . Pinched nerve    Pinched nerve in back    There are no active problems to display for this patient.   History reviewed. No pertinent surgical history.   Home Medications    Prior to Admission medications   Medication Sig Start Date End Date Taking? Authorizing Provider  albuterol (PROVENTIL HFA;VENTOLIN HFA) 108 (90 BASE) MCG/ACT inhaler Inhale 1-2 puffs into the lungs every 6 (six) hours as needed for wheezing or shortness of breath. 09/24/14   Lorre Nick, MD  omeprazole (PRILOSEC) 20 MG capsule Take 1 capsule (20 mg total) by mouth 2 (two) times daily before a meal. 09/29/14   Earley Favor, NP  predniSONE (DELTASONE) 10 MG tablet Take 1 tablet (10 mg total) by mouth daily. 09/29/14   Earley Favor, NP    Family History History reviewed. No pertinent family history.  Social History Social History  Substance Use Topics  . Smoking status: Current  Some Day Smoker  . Smokeless tobacco: Current User    Types: Snuff  . Alcohol use Yes   Allergies   Review of patient's allergies indicates no known allergies.   Review of Systems Review of Systems  Constitutional: Negative for chills and fever.  Musculoskeletal: Negative for neck pain and neck stiffness.  Neurological: Positive for weakness and numbness.    Physical Exam Updated Vital Signs BP 141/96 (BP Location: Right Arm)   Pulse 80   Temp 98.3 F (36.8 C)   Resp 16   Ht 5\' 7"  (1.702 m)   Wt 250 lb (113.4 kg)   SpO2 100%   BMI 39.16 kg/m   Physical Exam  Constitutional: He is oriented to person, place, and time. Vital signs are normal. He appears well-developed and well-nourished. No distress.  HENT:  Head: Normocephalic and atraumatic.  Right Ear: Hearing normal.  Left Ear: Hearing normal.  Eyes: Conjunctivae and EOM are normal. Pupils are equal, round, and reactive to light.  Neck: Normal range of motion. Neck supple.  Cardiovascular: Normal rate and regular rhythm.   Pulmonary/Chest: Effort normal. No respiratory distress.  Musculoskeletal: Normal range of motion.  Right Hand NVI. Motor/Sensation intact. Equal Grip Strengths.   Neurological: He is alert and oriented to person, place, and time. Coordination normal.  Skin: Skin is warm and dry. He is not diaphoretic.  Psychiatric: He has a normal mood  and affect. His speech is normal and behavior is normal. Thought content normal.  Nursing note and vitals reviewed.  ED Treatments / Results  DIAGNOSTIC STUDIES: Oxygen Saturation is 100% on RA, normal by my interpretation.    COORDINATION OF CARE: 8:16 PM Discussed treatment plan with pt at bedside which includes xrays and pt agreed to plan.  Labs (all labs ordered are listed, but only abnormal results are displayed) Labs Reviewed - No data to display  EKG  EKG Interpretation None      Radiology Dg Hand Complete Right  Result Date:  03/21/2016 CLINICAL DATA:  Pain in the second through fifth MCP joints for 1 week after using a PICC axis. Limited range of motion. Difficulty gripping items. EXAM: RIGHT HAND - COMPLETE 3+ VIEW COMPARISON:  None. FINDINGS: There is no evidence of fracture or dislocation. There is no evidence of arthropathy or other focal bone abnormality. Soft tissues are unremarkable. IMPRESSION: Negative. Electronically Signed   By: Burman NievesWilliam  Stevens M.D.   On: 03/21/2016 21:22    Procedures Procedures (including critical care time)  Medications Ordered in ED Medications - No data to display   Initial Impression / Assessment and Plan / ED Course  I have reviewed the triage vital signs and the nursing notes.  Pertinent labs & imaging results that were available during my care of the patient were reviewed by me and considered in my medical decision making (see chart for details).  Clinical Course    Final Clinical Impressions(s) / ED Diagnoses  I have reviewed and evaluated the relevant imaging studies.  I have reviewed the relevant previous healthcare records. I obtained HPI from historian.  ED Course:  Assessment: Pt is a 30yM who presents with right forearm pain s/p using pickaxe and striking rock x 1 week ago. Notes numbness occasionally. Full ROM of hand. Motor/sensation intact. On exam, pt in NAD. Nontoxic/nonseptic appearing. VSS. Afebrile. Imaging unremarkable with no acute abnormalities. Plan is to Rx Muscle relaxants. Counseled to reduce use of affected area. Possible nerve palsy from trauma by striking rock. At time of discharge, Patient is in no acute distress. Vital Signs are stable. Patient is able to ambulate. Patient able to tolerate PO.    Disposition/Plan:  DC Home Additional Verbal discharge instructions given and discussed with patient.  Pt Instructed to f/u with PCP in the next week for evaluation and treatment of symptoms. Return precautions given Pt acknowledges and agrees with  plan  Supervising Physician Benjiman CoreNathan Pickering, MD   Final diagnoses:  Forearm strain, right, initial encounter    New Prescriptions New Prescriptions   No medications on file     I personally performed the services described in this documentation, which was scribed in my presence. The recorded information has been reviewed and is accurate.       Audry Piliyler Josecarlos Harriott, PA-C 03/21/16 2132    Benjiman CoreNathan Pickering, MD 03/21/16 [phone]

## 2016-03-21 NOTE — Discharge Instructions (Signed)
Please read and follow all provided instructions.  Your diagnoses today include:  1. Forearm strain, right, initial encounter     Tests performed today include: Vital signs. See below for your results today.   Medications prescribed:  Take as prescribed   Home care instructions:  Follow any educational materials contained in this packet.  Follow-up instructions: Please follow-up [facility] for further evaluation of symptoms and treatment   Return instructions:  Please return to the Emergency Department if you do not get better, if you get worse, or new symptoms OR  - Fever (temperature greater than 101.48F)  - Bleeding that does not stop with holding pressure to the area    -Severe pain (please note that you may be more sore the day after your accident)  - Chest Pain  - Difficulty breathing  - Severe nausea or vomiting  - Inability to tolerate food and liquids  - Passing out  - Skin becoming red around your wounds  - Change in mental status (confusion or lethargy)  - New numbness or weakness    Please return if you have any other emergent concerns.  Additional Information:  Your vital signs today were: BP 141/96 (BP Location: Right Arm)    Pulse 80    Temp 98.3 F (36.8 C)    Resp 16    Ht 5\' 7"  (1.702 m)    Wt 113.4 kg    SpO2 100%    BMI 39.16 kg/m  If your blood pressure (BP) was elevated above 135/85 this visit, please have this repeated by your doctor within one month. ---------------

## 2016-06-09 ENCOUNTER — Encounter: Payer: Self-pay | Admitting: Medical

## 2016-07-21 ENCOUNTER — Encounter (HOSPITAL_COMMUNITY): Payer: Self-pay | Admitting: Emergency Medicine

## 2016-07-21 ENCOUNTER — Emergency Department (HOSPITAL_COMMUNITY)
Admission: EM | Admit: 2016-07-21 | Discharge: 2016-07-21 | Disposition: A | Discharge status: Home / Self Care | Payer: 59 | Attending: Emergency Medicine | Admitting: Emergency Medicine

## 2016-07-21 DIAGNOSIS — F17200 Nicotine dependence, unspecified, uncomplicated: Secondary | ICD-10-CM | POA: Insufficient documentation

## 2016-07-21 DIAGNOSIS — J029 Acute pharyngitis, unspecified: Secondary | ICD-10-CM | POA: Diagnosis present

## 2016-07-21 LAB — RAPID STREP SCREEN (MED CTR MEBANE ONLY): STREPTOCOCCUS, GROUP A SCREEN (DIRECT): NEGATIVE

## 2016-07-21 NOTE — ED Provider Notes (Signed)
[facility]-EMERGENCY DEPT Provider Note   CSN: [id number] Arrival date & time: 07/21/16  1212  By signing my name below, I, Alyssa GroveMartin Green, attest that this documentation has been prepared under the direction and in the presence of Roxy Horsemanobert Devarious Pavek, PA-C. Electronically Signed: Alyssa GroveMartin Green, ED Scribe. 07/21/16. 12:32 PM.   History   Chief Complaint Chief Complaint  Patient presents with  . Otalgia  . Sore Throat   The history is provided by the patient. No language interpreter was used.   HPI Comments: Mark Santos is a 30 y.o. male who presents to the Emergency Department complaining of gradual onset, constant, moderate left ear and throat pain onset this morning. He states he awoke this morning with both throat and ear pain. Pt reports associated fever (100.9). He took Ibuprofen with significant relief to symptoms. He states he has had strep throat many times in the past.  Past Medical History:  Diagnosis Date  . Pinched nerve    Pinched nerve in back    There are no active problems to display for this patient.   History reviewed. No pertinent surgical history.     Home Medications    Prior to Admission medications   Medication Sig Start Date End Date Taking? Authorizing Provider  albuterol (PROVENTIL HFA;VENTOLIN HFA) 108 (90 BASE) MCG/ACT inhaler Inhale 1-2 puffs into the lungs every 6 (six) hours as needed for wheezing or shortness of breath. 09/24/14   Lorre NickAnthony Allen, MD  methocarbamol (ROBAXIN) 500 MG tablet Take 1 tablet (500 mg total) by mouth 2 (two) times daily. 03/21/16   Audry Piliyler Mohr, PA-C  omeprazole (PRILOSEC) 20 MG capsule Take 1 capsule (20 mg total) by mouth 2 (two) times daily before a meal. 09/29/14   Earley FavorGail Schulz, NP  predniSONE (DELTASONE) 10 MG tablet Take 1 tablet (10 mg total) by mouth daily. 09/29/14   Earley FavorGail Schulz, NP    Family History History reviewed. No pertinent family history.  Social History Social History  Substance Use Topics  . Smoking status:  Current Some Day Smoker  . Smokeless tobacco: Current User    Types: Snuff  . Alcohol use Yes     Allergies   Patient has no known allergies.   Review of Systems Review of Systems  Constitutional: Positive for fever.  HENT: Positive for ear pain and sore throat.    Physical Exam Updated Vital Signs BP 133/82 (BP Location: Left Arm)   Pulse 71   Temp 98.4 F (36.9 C) (Oral)   Resp 16   SpO2 97%   Physical Exam Physical Exam  Constitutional: Pt  appears well-developed and well-nourished. No distress.  HENT:  Head: Normocephalic and atraumatic.  Right Ear: Tympanic membrane, external ear and ear canal normal.  Left Ear: Tympanic membrane, external ear and ear canal normal.  Nose: Nose normal. No mucosal edema or rhinorrhea.  Mouth/Throat: Oropharynx mildly erythematous, no abscess, no exudate Eyes: Conjunctivae are normal.  Neck: Normal range of motion, full passive range of motion without pain and phonation normal. No tracheal tenderness, no spinous process tenderness and no muscular tenderness present. No rigidity. No erythema and normal range of motion present. No Brudzinski's sign and no Kernig's sign noted.  Range of motion without pain  No midline or paraspinal tenderness Normal phonation No stridor Handling secretions without difficulty No nuchal rigidity or meningeal signs  Cardiovascular: Normal rate, regular rhythm and normal heart sounds.    Pulmonary/Chest: Effort normal and breath sounds normal. No stridor. No respiratory distress. No  decreased breath sounds. No wheezes.  Equal chest expansion, clear and equal breath sounds without focal wheezes, rhonchi or rales  Musculoskeletal: Normal range of motion.  Alert and oriented Moves all extremities without ataxia  Skin: Skin is warm and dry. Pt is not diaphoretic.  Psychiatric: Normal mood and affect.  Nursing note and vitals reviewed.    ED Treatments / Results  DIAGNOSTIC STUDIES: Oxygen Saturation is  97% on RA, normal by my interpretation.    COORDINATION OF CARE: 12:30 PM Discussed treatment plan with pt at bedside which includes rapid strep test and pt agreed to plan.  Labs (all labs ordered are listed, but only abnormal results are displayed) Labs Reviewed  RAPID STREP SCREEN (NOT [facility])    EKG  EKG Interpretation None       Radiology No results found.  Procedures Procedures (including critical care time)  Medications Ordered in ED Medications - No data to display   Initial Impression / Assessment and Plan / ED Course  I have reviewed the triage vital signs and the nursing notes.  Pertinent labs & imaging results that were available during my care of the patient were reviewed by me and considered in my medical decision making (see chart for details).  Clinical Course     Pt afebrile without tonsillar exudate, negative strep. Presents with mild cervical lymphadenopathy, & dysphagia; diagnosis of viral pharyngitis. No abx indicated. DC w symptomatic tx for pain  Pt does not appear dehydrated, but did discuss importance of water rehydration. Presentation non concerning for PTA or infxn spread to soft tissue. No trismus or uvula deviation. Specific return precautions discussed. Pt able to drink water in ED without difficulty with intact air way. Recommended PCP follow up.   Final Clinical Impressions(s) / ED Diagnoses   Final diagnoses:  Viral pharyngitis    New Prescriptions New Prescriptions   No medications on file   I personally performed the services described in this documentation, which was scribed in my presence. The recorded information has been reviewed and is accurate.      Roxy Horsemanobert Byrdie Miyazaki, PA-C 07/21/16 1331    Mancel BaleElliott Wentz, MD 07/21/16 [phone]

## 2016-07-21 NOTE — ED Notes (Signed)
Pt to ER by private vehicle with complaint of left ear and throat pain x3 days. Reports fever this morning of 100.9. NAD.

## 2016-07-21 NOTE — ED Notes (Signed)
Pt voices understanding of discharge instructions as well as follow up information. Pt given "Know Your Options" brochure with coupon card at discharge.

## 2016-07-24 LAB — CULTURE, GROUP A STREP (THRC)

## 2017-02-22 ENCOUNTER — Encounter (HOSPITAL_COMMUNITY): Payer: Self-pay | Admitting: Emergency Medicine

## 2017-02-22 ENCOUNTER — Emergency Department (HOSPITAL_COMMUNITY)
Admission: EM | Admit: 2017-02-22 | Discharge: 2017-02-22 | Disposition: A | Discharge status: Home / Self Care | Payer: 59 | Attending: Emergency Medicine | Admitting: Emergency Medicine

## 2017-02-22 DIAGNOSIS — K0889 Other specified disorders of teeth and supporting structures: Secondary | ICD-10-CM | POA: Diagnosis present

## 2017-02-22 DIAGNOSIS — F17210 Nicotine dependence, cigarettes, uncomplicated: Secondary | ICD-10-CM | POA: Diagnosis not present

## 2017-02-22 DIAGNOSIS — J3089 Other allergic rhinitis: Secondary | ICD-10-CM | POA: Diagnosis not present

## 2017-02-22 DIAGNOSIS — K029 Dental caries, unspecified: Secondary | ICD-10-CM | POA: Diagnosis not present

## 2017-02-22 DIAGNOSIS — Z79899 Other long term (current) drug therapy: Secondary | ICD-10-CM | POA: Insufficient documentation

## 2017-02-22 MED ORDER — HYDROCODONE-ACETAMINOPHEN 5-325 MG PO TABS: 1.0000 | ORAL_TABLET | Freq: Four times a day (QID) | ORAL | 0 refills | Status: DC | PRN

## 2017-02-22 MED ORDER — NAPROXEN 500 MG PO TABS: 500.0000 mg | ORAL_TABLET | Freq: Two times a day (BID) | ORAL | 0 refills | Status: DC

## 2017-02-22 MED ORDER — FLUTICASONE PROPIONATE 50 MCG/ACT NA SUSP: 2.0000 | Freq: Every day | NASAL | 1 refills | Status: DC

## 2017-02-22 NOTE — ED Notes (Signed)
Bed: WTR6 Expected date:  Expected time:  Means of arrival:  Comments: 

## 2017-02-22 NOTE — ED Triage Notes (Signed)
Pt reports he has an abscessed tooth on the R side. Has an appt to get it pulled Monday but the pain is unbearable.

## 2017-02-22 NOTE — ED Provider Notes (Signed)
[facility]-EMERGENCY DEPT Provider Note   CSN: [id number] Arrival date & time: 02/22/17  1157  By signing my name below, I, Deland PrettySherilynn Knight, attest that this documentation has been prepared under the direction and in the presence of Everlene FarrierWilliam Elmore Hyslop, PA-C. Electronically Signed: Deland PrettySherilynn Knight, ED Scribe. 02/22/17. 12:48 PM.  History   Chief Complaint Chief Complaint  Patient presents with  . Dental Pain    The history is provided by the patient. No language interpreter was used.   HPI Comments: Mark Santos is a 31 y.o. male who presents to the Emergency Department complaining of upper, right-sided gradually worsening, severe dental pain that began a PTA. The pt also complains of nasal congestion, sore throat, congestion, postnasal drip, that began this morning, and has improved this afternoon.  He reports that he took 4 ibuprofen 2 hours ago for the treatment of his pain with inadequate relief. He also reports taking prescribed Norco this morning with mild relief.  The pt indicates that he has a tooth extraction scheduled for 02/26/2017 and that he is currently taking penicillin prescribed for his symptoms. The pt denies chills, fever, drooling, ear pain, facial swelling, trouble swallowing, headaches, and eye pain.  Past Medical History:  Diagnosis Date  . Pinched nerve    Pinched nerve in back    There are no active problems to display for this patient.   History reviewed. No pertinent surgical history.     Home Medications    Prior to Admission medications   Medication Sig Start Date End Date Taking? Authorizing Provider  albuterol (PROVENTIL HFA;VENTOLIN HFA) 108 (90 BASE) MCG/ACT inhaler Inhale 1-2 puffs into the lungs every 6 (six) hours as needed for wheezing or shortness of breath. 09/24/14   Lorre NickAllen, Anthony, MD  fluticasone (FLONASE) 50 MCG/ACT nasal spray Place 2 sprays into both nostrils daily. 02/22/17   Everlene Farrieransie, Ashwika Freels, PA-C  HYDROcodone-acetaminophen (NORCO/VICODIN)  5-325 MG tablet Take 1-2 tablets by mouth every 6 (six) hours as needed for moderate pain or severe pain. 02/22/17   Everlene Farrieransie, Tabitha Tupper, PA-C  methocarbamol (ROBAXIN) 500 MG tablet Take 1 tablet (500 mg total) by mouth 2 (two) times daily. 03/21/16   Audry PiliMohr, Tyler, PA-C  naproxen (NAPROSYN) 500 MG tablet Take 1 tablet (500 mg total) by mouth 2 (two) times daily with a meal. 02/22/17   Everlene Farrieransie, Jenan Ellegood, PA-C  omeprazole (PRILOSEC) 20 MG capsule Take 1 capsule (20 mg total) by mouth 2 (two) times daily before a meal. 09/29/14   Earley FavorSchulz, Gail, NP  predniSONE (DELTASONE) 10 MG tablet Take 1 tablet (10 mg total) by mouth daily. 09/29/14   Earley FavorSchulz, Gail, NP    Family History No family history on file.  Social History Social History  Substance Use Topics  . Smoking status: Current Some Day Smoker  . Smokeless tobacco: Current User    Types: Snuff  . Alcohol use Yes     Allergies   Patient has no known allergies.   Review of Systems Review of Systems  Constitutional: Negative for chills and fever.  HENT: Positive for congestion, dental problem, postnasal drip and sore throat. Negative for drooling, ear pain, facial swelling, trouble swallowing and voice change.   Eyes: Negative for pain and visual disturbance.  Gastrointestinal: Negative for nausea and vomiting.  Musculoskeletal: Negative for neck pain and neck stiffness.  Skin: Negative for rash.  Neurological: Negative for dizziness and headaches.     Physical Exam Updated Vital Signs BP (!) 145/98 (BP Location: Right Arm)   Pulse  83   Temp 98.7 F (37.1 C) (Oral)   Resp 18   Ht 5\' 7"  (1.702 m)   Wt 122.5 kg (270 lb)   SpO2 100%   BMI 42.29 kg/m   Physical Exam  Constitutional: He is oriented to person, place, and time. He appears well-developed and well-nourished. No distress.  Non-toxic appearing.   HENT:  Head: Normocephalic and atraumatic.  Right Ear: External ear normal.  Left Ear: External ear normal.  Mouth/Throat: Oropharynx  is clear and moist. No oropharyngeal exudate.  Tenderness to right upper molar with significant dental caries. No abscess. No discharge from the mouth. No facial swelling.  Uvula is midline without edema. Soft palate rises symmetrically. No tonsillar hypertrophy or exudates. Tongue protrusion is normal. No trismus. No PTA.  Bilateral tympanic membranes are pearly-gray without erythema or loss of landmarks.  Rhinorrhea present.   Eyes: Pupils are equal, round, and reactive to light. Conjunctivae are normal. Right eye exhibits no discharge. Left eye exhibits no discharge.  Neck: Normal range of motion. Neck supple. No JVD present. No tracheal deviation present.  Cardiovascular: Normal rate, regular rhythm, normal heart sounds and intact distal pulses.   Pulmonary/Chest: Effort normal and breath sounds normal. No stridor. No respiratory distress.  Lymphadenopathy:    He has no cervical adenopathy.  Neurological: He is alert and oriented to person, place, and time. No cranial nerve deficit. Coordination normal.  Skin: Skin is warm and dry. No rash noted. He is not diaphoretic. No erythema. No pallor.  Psychiatric: He has a normal mood and affect. His behavior is normal.  Nursing note and vitals reviewed.    ED Treatments / Results   DIAGNOSTIC STUDIES: Oxygen Saturation is 98% on RA, normal by my interpretation.   COORDINATION OF CARE: 12:48 PM-Discussed next steps with pt. Pt verbalized understanding and is agreeable with the plan. ' Labs (all labs ordered are listed, but only abnormal results are displayed) Labs Reviewed - No data to display  EKG  EKG Interpretation None       Radiology No results found.  Procedures Procedures (including critical care time)  Medications Ordered in ED Medications - No data to display   Initial Impression / Assessment and Plan / ED Course  I have reviewed the triage vital signs and the nursing notes.  Pertinent labs & imaging results  that were available during my care of the patient were reviewed by me and considered in my medical decision making (see chart for details).    This is a 31 y.o. male who presents to the Emergency Department complaining of upper, right-sided gradually worsening, severe dental pain that began a PTA. The pt also complains of nasal congestion, sore throat, congestion, postnasal drip, that began this morning, and has improved this afternoon.  He reports that he took 4 ibuprofen 2 hours ago for the treatment of his pain with inadequate relief. He also reports taking prescribed Norco this morning with mild relief.  The pt indicates that he has a tooth extraction scheduled for 02/26/2017 and that he is currently taking penicillin prescribed for his symptoms. Patient was looked up on the [place] controlled substance database and the patient was prescribed a three-day supply of Norco 4 days ago. He tells me he did run out of his Norco this morning. He has scheduled follow-up with dental provider. Here for evaluation of dental pain. Chronically poor dentition. On exam, there is no evidence of a drainable abscess. No trismus, glossal elevation, unilateral  tonsillar swelling. No evidence of retropharyngeal or peritonsillar abscess or Ludwig angina. We'll discharge with a short course of Norco until he sees his dental provider this coming week. I discussed narcotic pain medication precautions. Naproxen also prescribed. I advised that she will not receive a refill on Norco. I advised to keep his appointment for dental extraction with his dentist.  Overall, appears well, nontoxic and appropriate for discharge. I advised the patient to follow-up with their primary care provider this week. I advised the patient to return to the emergency department with new or worsening symptoms or new concerns. The patient verbalized understanding and agreement with plan.    Final Clinical Impressions(s) / ED Diagnoses   Final  diagnoses:  Pain, dental  Dental caries  Seasonal allergic rhinitis due to other allergic trigger    New Prescriptions New Prescriptions   FLUTICASONE (FLONASE) 50 MCG/ACT NASAL SPRAY    Place 2 sprays into both nostrils daily.   HYDROCODONE-ACETAMINOPHEN (NORCO/VICODIN) 5-325 MG TABLET    Take 1-2 tablets by mouth every 6 (six) hours as needed for moderate pain or severe pain.   NAPROXEN (NAPROSYN) 500 MG TABLET    Take 1 tablet (500 mg total) by mouth 2 (two) times daily with a meal.   I personally performed the services described in this documentation, which was scribed in my presence. The recorded information has been reviewed and is accurate.       Everlene FarrierDansie, Aahana Elza, PA-C 02/22/17 1249    Mancel BaleWentz, Elliott, MD 02/23/17 [phone]

## 2018-05-02 ENCOUNTER — Emergency Department (HOSPITAL_COMMUNITY): Payer: Self-pay

## 2018-05-02 ENCOUNTER — Emergency Department (HOSPITAL_COMMUNITY)
Admission: EM | Admit: 2018-05-02 | Discharge: 2018-05-02 | Disposition: A | Discharge status: Home / Self Care | Payer: Self-pay | Attending: Emergency Medicine | Admitting: Emergency Medicine

## 2018-05-02 ENCOUNTER — Other Ambulatory Visit: Payer: Self-pay

## 2018-05-02 DIAGNOSIS — Z79899 Other long term (current) drug therapy: Secondary | ICD-10-CM | POA: Insufficient documentation

## 2018-05-02 DIAGNOSIS — R05 Cough: Secondary | ICD-10-CM | POA: Insufficient documentation

## 2018-05-02 DIAGNOSIS — F17220 Nicotine dependence, chewing tobacco, uncomplicated: Secondary | ICD-10-CM | POA: Insufficient documentation

## 2018-05-02 DIAGNOSIS — R0981 Nasal congestion: Secondary | ICD-10-CM | POA: Insufficient documentation

## 2018-05-02 DIAGNOSIS — R059 Cough, unspecified: Secondary | ICD-10-CM

## 2018-05-02 MED ORDER — PREDNISONE 20 MG PO TABS
60.0000 mg | ORAL_TABLET | Freq: Once | ORAL | Status: AC
  Administered 2018-05-02: 60 mg via ORAL
  Filled 2018-05-02: qty 3

## 2018-05-02 MED ORDER — BENZONATATE 100 MG PO CAPS: 100.0000 mg | ORAL_CAPSULE | Freq: Three times a day (TID) | ORAL | 0 refills | Status: DC

## 2018-05-02 MED ORDER — IPRATROPIUM-ALBUTEROL 0.5-2.5 (3) MG/3ML IN SOLN
3.0000 mL | Freq: Once | RESPIRATORY_TRACT | Status: AC
  Administered 2018-05-02: 3 mL via RESPIRATORY_TRACT
  Filled 2018-05-02: qty 3

## 2018-05-02 MED ORDER — ALBUTEROL SULFATE HFA 108 (90 BASE) MCG/ACT IN AERS
2.0000 | INHALATION_SPRAY | Freq: Once | RESPIRATORY_TRACT | Status: AC
  Administered 2018-05-02: 2 via RESPIRATORY_TRACT
  Filled 2018-05-02: qty 6.7

## 2018-05-02 NOTE — ED Triage Notes (Signed)
Patient reports cough x1 week, patient reports green sputum production, chest congestion and nasal congestion. Patient reports chills. Denies sore throat.

## 2018-05-02 NOTE — ED Notes (Signed)
Patient transported to X-ray 

## 2018-05-02 NOTE — ED Provider Notes (Signed)
[facility]-EMERGENCY DEPT Provider Note   CSN: [id number] Arrival date & time: 05/02/18  1533     History   Chief Complaint Chief Complaint  Patient presents with  . Cough  . Nasal Congestion    HPI Mark Santos is a 32 y.o. male without significant past medical history, presenting to the emergency department with complaint of 1 week of cough.  Cough is productive of green sputum with associated wheezing.  States he feels congested in his chest.  Also has associated nasal congestion and intermittent chills.  Reports subjective fever.  Denies sore throat, ear pain, difficulty breathing or swallowing.  Has been using throat lozenges to help his cough.  States he has similar episodes around this time every year, which was previously treated with breathing treatments and antibiotics.  No known history of asthma.  Denies tobacco use.  The history is provided by the patient.    Past Medical History:  Diagnosis Date  . Pinched nerve    Pinched nerve in back    There are no active problems to display for this patient.   No past surgical history on file.      Home Medications    Prior to Admission medications   Medication Sig Start Date End Date Taking? Authorizing Provider  albuterol (PROVENTIL HFA;VENTOLIN HFA) 108 (90 BASE) MCG/ACT inhaler Inhale 1-2 puffs into the lungs every 6 (six) hours as needed for wheezing or shortness of breath. 09/24/14   Lorre Nick, MD  benzonatate (TESSALON) 100 MG capsule Take 1 capsule (100 mg total) by mouth every 8 (eight) hours. 05/02/18   Teryl Gubler, [place] N, PA-C  fluticasone (FLONASE) 50 MCG/ACT nasal spray Place 2 sprays into both nostrils daily. 02/22/17   Everlene Farrier, PA-C  HYDROcodone-acetaminophen (NORCO/VICODIN) 5-325 MG tablet Take 1-2 tablets by mouth every 6 (six) hours as needed for moderate pain or severe pain. 02/22/17   Everlene Farrier, PA-C  methocarbamol (ROBAXIN) 500 MG tablet Take 1 tablet (500 mg  total) by mouth 2 (two) times daily. 03/21/16   Audry Pili, PA-C  naproxen (NAPROSYN) 500 MG tablet Take 1 tablet (500 mg total) by mouth 2 (two) times daily with a meal. 02/22/17   Everlene Farrier, PA-C  omeprazole (PRILOSEC) 20 MG capsule Take 1 capsule (20 mg total) by mouth 2 (two) times daily before a meal. 09/29/14   Earley Favor, NP  predniSONE (DELTASONE) 10 MG tablet Take 1 tablet (10 mg total) by mouth daily. 09/29/14   Earley Favor, NP    Family History No family history on file.  Social History Social History   Tobacco Use  . Smoking status: Current Some Day Smoker  . Smokeless tobacco: Current User    Types: Snuff  Substance Use Topics  . Alcohol use: Yes  . Drug use: Yes    Types: Marijuana     Allergies   Patient has no known allergies.   Review of Systems Review of Systems  Constitutional: Positive for chills and fever.  HENT: Positive for congestion. Negative for ear pain, sore throat and trouble swallowing.   Respiratory: Positive for cough and chest tightness.      Physical Exam Updated Vital Signs BP 102/87 (BP Location: Right Arm)   Pulse 93   Temp 98.6 F (37 C) (Oral)   Resp 18   Ht 5\' 7"  (1.702 m)   Wt 124.7 kg   SpO2 98%   BMI 43.07 kg/m   Physical Exam  Constitutional: He appears well-developed  and well-nourished. No distress.  HENT:  Head: Normocephalic and atraumatic.  Right Ear: Tympanic membrane and ear canal normal.  Left Ear: Tympanic membrane and ear canal normal.  Mouth/Throat: Uvula is midline. Posterior oropharyngeal erythema (Mild) present. No oropharyngeal exudate or posterior oropharyngeal edema.  Eyes: Conjunctivae are normal.  Neck: Normal range of motion. Neck supple. No tracheal deviation present.  Cardiovascular: Normal rate, regular rhythm, normal heart sounds and intact distal pulses.  Pulmonary/Chest: Effort normal. No stridor. No respiratory distress. He has wheezes (Diffuse expiratory wheezes bilaterally).    Lymphadenopathy:    He has no cervical adenopathy.  Psychiatric: He has a normal mood and affect. His behavior is normal.  Nursing note and vitals reviewed.    ED Treatments / Results  Labs (all labs ordered are listed, but only abnormal results are displayed) Labs Reviewed - No data to display  EKG None  Radiology Dg Chest 2 View  Result Date: 05/02/2018 CLINICAL DATA:  Cough and congestion EXAM: CHEST - 2 VIEW COMPARISON:  September 29, 2014 FINDINGS: No edema or consolidation. The heart size and pulmonary vascularity are normal. No adenopathy. No bone lesions. IMPRESSION: No edema or consolidation. Electronically Signed   By: Bretta Bang III M.D.   On: 05/02/2018 16:31    Procedures Procedures (including critical care time)  Medications Ordered in ED Medications  ipratropium-albuterol (DUONEB) 0.5-2.5 (3) MG/3ML nebulizer solution 3 mL (3 mLs Nebulization Given 05/02/18 1619)  predniSONE (DELTASONE) tablet 60 mg (60 mg Oral Given 05/02/18 1617)  albuterol (PROVENTIL HFA;VENTOLIN HFA) 108 (90 Base) MCG/ACT inhaler 2 puff (2 puffs Inhalation Given 05/02/18 1659)     Initial Impression / Assessment and Plan / ED Course  I have reviewed the triage vital signs and the nursing notes.  Pertinent labs & imaging results that were available during my care of the patient were reviewed by me and considered in my medical decision making (see chart for details).  Clinical Course as of May 02 1724  Thu May 02, 2018  1653 Chest x-ray is negative.  Patient reevaluated after breathing treatment with improvement in symptoms.  Lung exam is significantly improved, wheezes resolved.  Will discharge with inhaler, Tessalon, PCP referral for follow-up.  Instructed patient to take allergy medicine as well.  Strict return precautions   [JR]    Clinical Course User Index [JR] Clarissia Mckeen, [place] N, PA-C    Patient presenting with 1 week of cough and congestion.  Similar episodes during this  time of year in the past.  On exam, vital signs are stable, afebrile, normal O2 saturation.  No increased work of breathing though there are expiratory wheezes bilaterally.  ENT exam is unremarkable.  Chest x-ray is without infiltrate.  Treated with DuoNeb and prednisone with improvement in symptoms and significant improvement on lung exam.  Suspect reactive airway disease versus viral illness.  Discussed symptomatic management including OTC allergy medications.  Albuterol inhaler and Tessalon prescribed.  Strict return precautions.  PCP referral provided for follow-up.  Safe for discharge.  Discussed results, findings, treatment and follow up. Patient advised of return precautions. Patient verbalized understanding and agreed with plan.   Final Clinical Impressions(s) / ED Diagnoses   Final diagnoses:  Nasal congestion  Cough    ED Discharge Orders         Ordered    benzonatate (TESSALON) 100 MG capsule  Every 8 hours     05/02/18 1655           Roxan Hockey, [place]  N, PA-C 05/02/18 1725    Wynetta Fines, MD 05/02/18 [phone]

## 2018-05-02 NOTE — Discharge Instructions (Signed)
Begin taking daily allergy medication such as Zyrtec or Claritin for improvement in symptoms. Use the albuterol inhaler, 1 to 2 puffs every 4-6 hours as needed for shortness of breath or chest tightness. Take Tessalon every 8 hours as needed for cough. Drink plenty of water. Establish primary care and follow-up. Return to the emergency department if you experience worsening shortness of breath, or new or worsening symptoms.

## 2019-02-19 ENCOUNTER — Encounter (HOSPITAL_COMMUNITY): Payer: Self-pay | Admitting: Emergency Medicine

## 2019-02-19 ENCOUNTER — Emergency Department (HOSPITAL_COMMUNITY): Payer: Managed Care, Other (non HMO)

## 2019-02-19 ENCOUNTER — Other Ambulatory Visit: Payer: Self-pay

## 2019-02-19 DIAGNOSIS — Z5321 Procedure and treatment not carried out due to patient leaving prior to being seen by health care provider: Secondary | ICD-10-CM | POA: Diagnosis not present

## 2019-02-19 DIAGNOSIS — R05 Cough: Secondary | ICD-10-CM | POA: Diagnosis present

## 2019-02-19 NOTE — ED Triage Notes (Signed)
Patient here from home with complaints of cough, fever, and nausea x2 days. Reports that daughter is sick and tested negative for COVID. Patient denies having being tested.

## 2019-02-20 ENCOUNTER — Emergency Department (HOSPITAL_COMMUNITY)
Admission: EM | Admit: 2019-02-20 | Discharge: 2019-02-20 | Discharge status: Against Medical Advice | Payer: Managed Care, Other (non HMO) | Attending: Emergency Medicine | Admitting: Emergency Medicine

## 2019-03-11 ENCOUNTER — Other Ambulatory Visit: Payer: Self-pay

## 2019-03-11 DIAGNOSIS — Z20822 Contact with and (suspected) exposure to covid-19: Secondary | ICD-10-CM

## 2019-03-13 LAB — NOVEL CORONAVIRUS, NAA: SARS-CoV-2, NAA: DETECTED — AB

## 2019-04-24 ENCOUNTER — Emergency Department (HOSPITAL_COMMUNITY): Payer: Managed Care, Other (non HMO)

## 2019-04-24 ENCOUNTER — Encounter (HOSPITAL_COMMUNITY): Payer: Self-pay | Admitting: Emergency Medicine

## 2019-04-24 ENCOUNTER — Emergency Department (HOSPITAL_COMMUNITY)
Admission: EM | Admit: 2019-04-24 | Discharge: 2019-04-25 | Disposition: A | Discharge status: Home / Self Care | Payer: Managed Care, Other (non HMO) | Attending: Emergency Medicine | Admitting: Emergency Medicine

## 2019-04-24 ENCOUNTER — Other Ambulatory Visit: Payer: Self-pay

## 2019-04-24 DIAGNOSIS — F17210 Nicotine dependence, cigarettes, uncomplicated: Secondary | ICD-10-CM | POA: Diagnosis not present

## 2019-04-24 DIAGNOSIS — R1013 Epigastric pain: Secondary | ICD-10-CM | POA: Diagnosis present

## 2019-04-24 DIAGNOSIS — Z79899 Other long term (current) drug therapy: Secondary | ICD-10-CM | POA: Insufficient documentation

## 2019-04-24 DIAGNOSIS — R112 Nausea with vomiting, unspecified: Secondary | ICD-10-CM | POA: Diagnosis not present

## 2019-04-24 LAB — CBC
HCT: 44.2 % (ref 39.0–52.0)
Hemoglobin: 14.8 g/dL (ref 13.0–17.0)
MCH: 30.3 pg (ref 26.0–34.0)
MCHC: 33.5 g/dL (ref 30.0–36.0)
MCV: 90.6 fL (ref 80.0–100.0)
Platelets: 174 10*3/uL (ref 150–400)
RBC: 4.88 MIL/uL (ref 4.22–5.81)
RDW: 12.9 % (ref 11.5–15.5)
WBC: 5.4 10*3/uL (ref 4.0–10.5)
nRBC: 0 % (ref 0.0–0.2)

## 2019-04-24 LAB — COMPREHENSIVE METABOLIC PANEL
ALT: 86 U/L — ABNORMAL HIGH (ref 0–44)
AST: 99 U/L — ABNORMAL HIGH (ref 15–41)
Albumin: 4.2 g/dL (ref 3.5–5.0)
Alkaline Phosphatase: 85 U/L (ref 38–126)
Anion gap: 9 (ref 5–15)
BUN: 6 mg/dL (ref 6–20)
CO2: 24 mmol/L (ref 22–32)
Calcium: 8.7 mg/dL — ABNORMAL LOW (ref 8.9–10.3)
Chloride: 106 mmol/L (ref 98–111)
Creatinine, Ser: 1.01 mg/dL (ref 0.61–1.24)
GFR calc Af Amer: >60 mL/min (ref >60)
GFR calc non Af Amer: >60 mL/min (ref >60)
Glucose, Bld: 112 mg/dL — ABNORMAL HIGH (ref 70–99)
Potassium: 3.8 mmol/L (ref 3.5–5.1)
Sodium: 139 mmol/L (ref 135–145)
Total Bilirubin: 1.2 mg/dL (ref 0.3–1.2)
Total Protein: 7.6 g/dL (ref 6.5–8.1)

## 2019-04-24 LAB — URINALYSIS, ROUTINE W REFLEX MICROSCOPIC
Bilirubin Urine: NEGATIVE
Glucose, UA: NEGATIVE mg/dL
Hgb urine dipstick: NEGATIVE
Ketones, ur: NEGATIVE mg/dL
Leukocytes,Ua: NEGATIVE
Nitrite: NEGATIVE
Protein, ur: 30 mg/dL — AB
Specific Gravity, Urine: >1.046 — ABNORMAL HIGH (ref 1.005–1.030)
pH: 6 (ref 5.0–8.0)

## 2019-04-24 LAB — LIPASE, BLOOD: Lipase: 23 U/L (ref 11–51)

## 2019-04-24 MED ORDER — ONDANSETRON HCL 4 MG/2ML IJ SOLN
4.0000 mg | Freq: Once | INTRAMUSCULAR | Status: AC
Start: 2019-04-24 — End: 2019-04-24
  Administered 2019-04-24: 21:00:00 4 mg via INTRAVENOUS
  Filled 2019-04-24: qty 2

## 2019-04-24 MED ORDER — SODIUM CHLORIDE 0.9 % IV BOLUS
1000.0000 mL | Freq: Once | INTRAVENOUS | Status: AC
  Administered 2019-04-24: 21:00:00 1000 mL via INTRAVENOUS

## 2019-04-24 MED ORDER — SODIUM CHLORIDE (PF) 0.9 % IJ SOLN
INTRAMUSCULAR | Status: AC
  Filled 2019-04-24: qty 50

## 2019-04-24 MED ORDER — PANTOPRAZOLE SODIUM 40 MG IV SOLR
40.0000 mg | Freq: Once | INTRAVENOUS | Status: AC
  Administered 2019-04-24: 40 mg via INTRAVENOUS
  Filled 2019-04-24: qty 40

## 2019-04-24 MED ORDER — IOHEXOL 300 MG/ML  SOLN
100.0000 mL | Freq: Once | INTRAMUSCULAR | Status: AC | PRN
  Administered 2019-04-24: 21:00:00 100 mL via INTRAVENOUS

## 2019-04-24 NOTE — ED Triage Notes (Signed)
Pt reports been vomiting since last Friday. Today been blood in vomit. Had dark stools. Taken Pepto and Pepcid. Smokes THC. Last use was yesterday.

## 2019-04-24 NOTE — ED Notes (Signed)
Patient transported to CT 

## 2019-04-25 MED ORDER — LIDOCAINE VISCOUS HCL 2 % MT SOLN
15.0000 mL | Freq: Once | OROMUCOSAL | Status: AC
  Administered 2019-04-25: 01:00:00 15 mL via ORAL
  Filled 2019-04-25: qty 15

## 2019-04-25 MED ORDER — SUCRALFATE 1 G PO TABS: 1.0000 g | ORAL_TABLET | Freq: Three times a day (TID) | ORAL | 1 refills | Status: DC

## 2019-04-25 MED ORDER — ONDANSETRON 4 MG PO TBDP: 4.0000 mg | ORAL_TABLET | Freq: Three times a day (TID) | ORAL | 0 refills | Status: DC | PRN

## 2019-04-25 MED ORDER — DICYCLOMINE HCL 20 MG PO TABS: 20.0000 mg | ORAL_TABLET | Freq: Two times a day (BID) | ORAL | 0 refills | Status: DC

## 2019-04-25 MED ORDER — OMEPRAZOLE 20 MG PO CPDR: 20.0000 mg | DELAYED_RELEASE_CAPSULE | Freq: Every day | ORAL | 1 refills | Status: DC

## 2019-04-25 MED ORDER — ALUM & MAG HYDROXIDE-SIMETH 200-200-20 MG/5ML PO SUSP
30.0000 mL | Freq: Once | ORAL | Status: AC
  Administered 2019-04-25: 01:00:00 30 mL via ORAL
  Filled 2019-04-25: qty 30

## 2019-04-25 NOTE — ED Provider Notes (Signed)
[facility]-EMERGENCY DEPT Provider Note   CSN: [id number] Arrival date & time: 04/24/19  1720     History   Chief Complaint Chief Complaint  Patient presents with   Emesis    HPI Mark Santos is a 33 y.o. male.     Patient with history of daily alcohol use presents to the emergency department today with complaint of upper abdominal pain and vomiting starting 6 to 7 days ago.  At first, patient thought that he had food poisoning.  He had persistent vomiting with solid foods as well as watery stool without blood.  Over the course of the week his symptoms did not improve all that much.  He states that he tried to drink some beer yesterday "which was a mistake".  Patient noted red blood with vomiting.  He has been taking Pepto-Bismol which is been giving him black stools.  Abdominal pain is mostly in the upper abdomen but sometimes he feels pain in the left lower quadrant.  Associated low-grade fever he does smoke marijuana.  No history of abdominal surgeries.  He has been taking Pepcid without improvement.  Denies heavy NSAID use.     Past Medical History:  Diagnosis Date   Pinched nerve    Pinched nerve in back    There are no active problems to display for this patient.   History reviewed. No pertinent surgical history.      Home Medications    Prior to Admission medications   Medication Sig Start Date End Date Taking? Authorizing Provider  bismuth subsalicylate (PEPTO BISMOL) 262 MG/15ML suspension Take 30 mLs by mouth every 6 (six) hours as needed for indigestion.   Yes [provider]  famotidine-calcium carbonate-magnesium hydroxide (PEPCID COMPLETE) 10-800-165 MG chewable tablet Chew 1 tablet by mouth daily as needed (nausea, vomiting).   Yes [provider]  ibuprofen (ADVIL) 200 MG tablet Take 200 mg by mouth every 6 (six) hours as needed for moderate pain.   Yes [provider]  dicyclomine (BENTYL) 20 MG  tablet Take 1 tablet (20 mg total) by mouth 2 (two) times daily. 04/25/19   Renne CriglerGeiple, Airrion Otting, PA-C  omeprazole (PRILOSEC) 20 MG capsule Take 1 capsule (20 mg total) by mouth daily. 04/25/19   Renne CriglerGeiple, Dalon Reichart, PA-C  ondansetron (ZOFRAN ODT) 4 MG disintegrating tablet Take 1 tablet (4 mg total) by mouth every 8 (eight) hours as needed for nausea or vomiting. 04/25/19   Renne CriglerGeiple, Merie Wulf, PA-C  sucralfate (CARAFATE) 1 g tablet Take 1 tablet (1 g total) by mouth 4 (four) times daily -  with meals and at bedtime. 04/25/19   Renne CriglerGeiple, Zaylynn Rickett, PA-C  albuterol (PROVENTIL HFA;VENTOLIN HFA) 108 (90 BASE) MCG/ACT inhaler Inhale 1-2 puffs into the lungs every 6 (six) hours as needed for wheezing or shortness of breath. Patient not taking: Reported on 04/24/2019 09/24/14 04/25/19  Lorre NickAllen, Anthony, MD  fluticasone [facility](FLONASE) 50 MCG/ACT nasal spray Place 2 sprays into both nostrils daily. Patient not taking: Reported on 04/24/2019 02/22/17 04/25/19  Everlene Farrieransie, William, PA-C    Family History No family history on file.  Social History Social History   Tobacco Use   Smoking status: Current Some Day Smoker   Smokeless tobacco: Current User    Types: Snuff  Substance Use Topics   Alcohol use: Yes   Drug use: Yes    Types: Marijuana     Allergies   Patient has no known allergies.   Review of Systems Review of Systems  Constitutional: Negative  for fever.  HENT: Negative for rhinorrhea and sore throat.   Eyes: Negative for redness.  Respiratory: Negative for cough.   Cardiovascular: Negative for chest pain.  Gastrointestinal: Positive for abdominal pain, diarrhea, nausea and vomiting. Negative for blood in stool.  Genitourinary: Negative for dysuria.  Musculoskeletal: Negative for myalgias.  Skin: Negative for rash.  Neurological: Negative for headaches.     Physical Exam Updated Vital Signs BP 122/81 (BP Location: Right Arm)    Pulse 78    Temp 100.3 F (37.9 C) (Oral)    Resp 20    SpO2 98%   Physical  Exam Vitals signs and nursing note reviewed.  Constitutional:      Appearance: He is well-developed.  HENT:     Head: Normocephalic and atraumatic.  Eyes:     General:        Right eye: No discharge.        Left eye: No discharge.     Conjunctiva/sclera: Conjunctivae normal.  Neck:     Musculoskeletal: Normal range of motion and neck supple.  Cardiovascular:     Rate and Rhythm: Normal rate and regular rhythm.     Heart sounds: Normal heart sounds.  Pulmonary:     Effort: Pulmonary effort is normal.     Breath sounds: Normal breath sounds.  Abdominal:     Palpations: Abdomen is soft.     Tenderness: There is abdominal tenderness in the right upper quadrant, epigastric area, left upper quadrant and left lower quadrant. There is no guarding or rebound. Negative signs include Murphy's sign and Rovsing's sign.    Skin:    General: Skin is warm and dry.  Neurological:     Mental Status: He is alert.      ED Treatments / Results  Labs (all labs ordered are listed, but only abnormal results are displayed) Labs Reviewed  COMPREHENSIVE METABOLIC PANEL - Abnormal; Notable for the following components:      Result Value   Glucose, Bld 112 (*)    Calcium 8.7 (*)    AST 99 (*)    ALT 86 (*)    All other components within normal limits  URINALYSIS, ROUTINE W REFLEX MICROSCOPIC - Abnormal; Notable for the following components:   Specific Gravity, Urine >1.046 (*)    Protein, ur 30 (*)    Bacteria, UA RARE (*)    All other components within normal limits  LIPASE, BLOOD  CBC    EKG None  Radiology Ct Abdomen Pelvis W Contrast  Result Date: 04/24/2019 CLINICAL DATA:  Vomiting since Friday, blood in emesis. Abdominal pain with fever EXAM: CT ABDOMEN AND PELVIS WITH CONTRAST TECHNIQUE: Multidetector CT imaging of the abdomen and pelvis was performed using the standard protocol following bolus administration of intravenous contrast. CONTRAST:  OMNIPAQUE IOHEXOL 300 MG/ML   SOLN COMPARISON:  None. FINDINGS: Lower chest: Lung bases are clear. Normal heart size. No pericardial effusion. Hepatobiliary: Diffuse hepatic hypoattenuation compatible with hepatic steatosis. No focal liver abnormality is seen. No gallstones, gallbladder wall thickening, or biliary dilatation. Pancreas: Unremarkable. No pancreatic ductal dilatation or surrounding inflammatory changes. Spleen: Normal in size without focal abnormality. Adrenals/Urinary Tract: Adrenal glands are unremarkable. Kidneys are normal, without renal calculi, focal lesion, or hydronephrosis. Mild bladder wall thickening though possibly related to underdistention. Stomach/Bowel: Distal esophagus, stomach and duodenal sweep are unremarkable. Question of mildly edematous appearance of the terminal ileum though bowel is grossly under distended. No evidence of obstruction. A normal appendix is  visualized. No colonic dilatation or wall thickening. Scattered colonic diverticula without focal pericolonic inflammation to suggest diverticulitis. Vascular/Lymphatic: The aorta is normal caliber. No suspicious or enlarged lymph nodes in the included lymphatic chains. Reproductive: The prostate and seminal vesicles are unremarkable. Other: No abdominopelvic free fluid or free gas. No bowel containing hernias. Musculoskeletal: No acute osseous abnormality or suspicious osseous lesion. IMPRESSION: Suspect some mild edematous mural thickening in the distal small bowel though under distended. Could reflect an enteritis. Mild thickening of the urinary bladder, likely related to underdistention. Correlate for urinary symptoms. No other acute abnormality in the abdomen or pelvis. Hepatic steatosis. Electronically Signed   By: Kreg Shropshire M.D.   On: 04/24/2019 21:43    Procedures Procedures (including critical care time)  Medications Ordered in ED Medications  sodium chloride (PF) 0.9 % injection (has no administration in time range)  alum & mag  hydroxide-simeth (MAALOX/MYLANTA) 200-200-20 MG/5ML suspension 30 mL (has no administration in time range)    And  lidocaine (XYLOCAINE) 2 % viscous mouth solution 15 mL (has no administration in time range)  sodium chloride 0.9 % bolus 1,000 mL (0 mLs Intravenous Stopped 04/24/19 2325)  ondansetron (ZOFRAN) injection 4 mg (4 mg Intravenous Given 04/24/19 2105)  pantoprazole (PROTONIX) injection 40 mg (40 mg Intravenous Given 04/24/19 2105)  iohexol (OMNIPAQUE) 300 MG/ML solution 100 mL (100 mLs Intravenous Contrast Given 04/24/19 2126)     Initial Impression / Assessment and Plan / ED Course  I have reviewed the triage vital signs and the nursing notes.  Pertinent labs & imaging results that were available during my care of the patient were reviewed by me and considered in my medical decision making (see chart for details).        Patient seen and examined.  Lab work-up is reassuring however patient symptoms have not improved much over the past several days and of lasted longer than I would typically expect for viral gastroenteritis or food poisoning.  Patient is also running a low-grade fever.  Given this, I discussed imaging with the patient.  He agrees to proceed with CT imaging to rule out more serious infectious etiology.  In the interim we will treat with IV Protonix, IV fluids, IV Zofran.  Vital signs reviewed and are as follows: BP 122/81 (BP Location: Right Arm)    Pulse 78    Temp 100.3 F (37.9 C) (Oral)    Resp 20    SpO2 98%    CT results reviewed.  Patient has some signs of enteritis.  I suspect that this is likely related to the patient's symptoms.  He very well could have gastritis, duodenitis, enteritis.  This could be exacerbated by alcohol use.  Cannot rule out peptic ulcer disease.  No signs of obstruction or perforation tonight.  Discussed results with patient.  He is feeling a bit better.  Plan to discharged home with omeprazole, Zofran, Carafate, Bentyl for symptom  control.  Will give GI and PCP referrals.  Encourage patient to stick with a clear liquid to bland diet over the next several days.  Encouraged avoidance of NSAIDs and alcohol.  The patient was urged to return to the Emergency Department immediately with worsening of current symptoms, worsening abdominal pain, persistent vomiting, blood noted in stools, fever, or any other concerns. The patient verbalized understanding.     Final Clinical Impressions(s) / ED Diagnoses   Final diagnoses:  Epigastric abdominal pain   Patient with abdominal pain with nausea, vomiting, and diarrhea  as above.  Patient has had some blood in his vomit today, likely related to Mallory-Weiss tears.  Normal hemoglobin.. Vitals are stable, temperature of 100.3 F on arrival here today. Labs are reassuring.  Exam with mild tenderness without rebound or guarding.  Imaging CT imaging reveals signs of some enteritis but no perforation, more serious infection, obstruction. No signs of significant dehydration, patient is tolerating PO's. Lungs are clear and no signs suggestive of PNA. Low concern for appendicitis, cholecystitis, pancreatitis, ruptured viscus, UTI, kidney stone, aortic dissection, aortic aneurysm or other emergent abdominal etiology. Supportive therapy indicated with return if symptoms worsen.    ED Discharge Orders         Ordered    omeprazole (PRILOSEC) 20 MG capsule  Daily     04/25/19 0009    ondansetron (ZOFRAN ODT) 4 MG disintegrating tablet  Every 8 hours PRN     04/25/19 0009    sucralfate (CARAFATE) 1 g tablet  3 times daily with meals & bedtime     04/25/19 0009    dicyclomine (BENTYL) 20 MG tablet  2 times daily     04/25/19 0009           Carlisle Cater, PA-C 04/25/19 Evelina Dun, MD 04/25/19 [phone]

## 2019-04-25 NOTE — Discharge Instructions (Signed)
Please read and follow all provided instructions.  Your diagnoses today include:  1. Epigastric abdominal pain    Tests performed today include:  Blood counts and electrolytes  Blood tests to check liver and kidney function  Blood tests to check pancreas function  Urine test to look for infection and pregnancy (in women)  Vital signs. See below for your results today.   Medications prescribed:   Omeprazole (Prilosec) - stomach acid reducer  This medication can be found over-the-counter   Carafate - for stomach upset and to protect your stomach   Zofran (ondansetron) - for nausea and vomiting   Bentyl - medication for intestinal cramps and spasms  Take any prescribed medications only as directed.  Home care instructions:   Follow any educational materials contained in this packet.  Follow-up instructions: Please follow-up with your primary care provider in the next 3 days for further evaluation of your symptoms.    Return instructions:  SEEK IMMEDIATE MEDICAL ATTENTION IF:  The pain does not go away or becomes severe   A temperature above 101F develops   Repeated vomiting occurs (multiple episodes)   The pain becomes localized to portions of the abdomen. The right side could possibly be appendicitis. In an adult, the left lower portion of the abdomen could be colitis or diverticulitis.   Blood is being passed in stools or vomit (bright red or black tarry stools)   You develop chest pain, difficulty breathing, dizziness or fainting, or become confused, poorly responsive, or inconsolable (young children)  If you have any other emergent concerns regarding your health  Additional Information: Abdominal (belly) pain can be caused by many things. Your caregiver performed an examination and possibly ordered blood/urine tests and imaging (CT scan, x-rays, ultrasound). Many cases can be observed and treated at home after initial evaluation in the emergency department.  Even though you are being discharged home, abdominal pain can be unpredictable. Therefore, you need a repeated exam if your pain does not resolve, returns, or worsens. Most patients with abdominal pain don't have to be admitted to the hospital or have surgery, but serious problems like appendicitis and gallbladder attacks can start out as nonspecific pain. Many abdominal conditions cannot be diagnosed in one visit, so follow-up evaluations are very important.  Your vital signs today were: BP 122/81 (BP Location: Right Arm)    Pulse 78    Temp 100.3 F (37.9 C) (Oral)    Resp 20    SpO2 98%  If your blood pressure (bp) was elevated above 135/85 this visit, please have this repeated by your doctor within one month. --------------

## 2020-01-13 ENCOUNTER — Emergency Department (HOSPITAL_COMMUNITY)
Admission: EM | Admit: 2020-01-13 | Discharge: 2020-01-13 | Disposition: A | Discharge status: Home / Self Care | Payer: Managed Care, Other (non HMO) | Attending: Emergency Medicine | Admitting: Emergency Medicine

## 2020-01-13 ENCOUNTER — Encounter (HOSPITAL_COMMUNITY): Payer: Self-pay | Admitting: Emergency Medicine

## 2020-01-13 DIAGNOSIS — X58XXXA Exposure to other specified factors, initial encounter: Secondary | ICD-10-CM | POA: Insufficient documentation

## 2020-01-13 DIAGNOSIS — Y999 Unspecified external cause status: Secondary | ICD-10-CM | POA: Insufficient documentation

## 2020-01-13 DIAGNOSIS — H5711 Ocular pain, right eye: Secondary | ICD-10-CM | POA: Insufficient documentation

## 2020-01-13 DIAGNOSIS — Y929 Unspecified place or not applicable: Secondary | ICD-10-CM | POA: Insufficient documentation

## 2020-01-13 DIAGNOSIS — Y939 Activity, unspecified: Secondary | ICD-10-CM | POA: Insufficient documentation

## 2020-01-13 DIAGNOSIS — S0501XA Injury of conjunctiva and corneal abrasion without foreign body, right eye, initial encounter: Secondary | ICD-10-CM | POA: Diagnosis not present

## 2020-01-13 DIAGNOSIS — H53149 Visual discomfort, unspecified: Secondary | ICD-10-CM | POA: Insufficient documentation

## 2020-01-13 DIAGNOSIS — F17220 Nicotine dependence, chewing tobacco, uncomplicated: Secondary | ICD-10-CM | POA: Diagnosis not present

## 2020-01-13 DIAGNOSIS — S0591XA Unspecified injury of right eye and orbit, initial encounter: Secondary | ICD-10-CM | POA: Diagnosis present

## 2020-01-13 MED ORDER — TETRACAINE HCL 0.5 % OP SOLN
1.0000 [drp] | Freq: Once | OPHTHALMIC | Status: AC
  Administered 2020-01-13: 1 [drp] via OPHTHALMIC
  Filled 2020-01-13: qty 4

## 2020-01-13 MED ORDER — LACTATED RINGERS IV BOLUS (SEPSIS): 1000.0000 mL | Freq: Once | INTRAVENOUS | Status: DC

## 2020-01-13 MED ORDER — FLUORESCEIN SODIUM 1 MG OP STRP
1.0000 | ORAL_STRIP | Freq: Once | OPHTHALMIC | Status: AC
  Administered 2020-01-13: 1 via OPHTHALMIC
  Filled 2020-01-13: qty 1

## 2020-01-13 MED ORDER — ERYTHROMYCIN 5 MG/GM OP OINT
TOPICAL_OINTMENT | Freq: Once | OPHTHALMIC | Status: AC
  Administered 2020-01-13: 1 via OPHTHALMIC
  Filled 2020-01-13: qty 3.5

## 2020-01-13 NOTE — Progress Notes (Signed)
Per MD order - sepsis protocol was canceled/discontinued @ 1052. Will discontinuing tracking by this elink RN - Shearon Balo.
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
## 2020-01-13 NOTE — ED Provider Notes (Signed)
[facility]-EMERGENCY DEPT Provider Note   CSN: [id number] Arrival date & time: 01/13/20  8182     History Chief Complaint  Patient presents with  . Eye Injury    Mark Santos is an otherwise healthy 34 y.o. male presented for evaluation of a right sided eye injury. Reports around 10 PM last night his 67-year-old daughter went to hug him and ended up sticking her finger in his right eye. No loose fingernail or foreign objects on her hand. No eye pain prior to episode, , but does report the lateral portion of his conjunctiva was erythematous prior to this due to "fishing event" last week. He immediately felt discomfort, used lubricating eyedrops with some mild relief. Woke up this morning with his eyelashes matted together, erythematous eye, and excessive watery tearing.   His pain has worsened since onset, constant burning and gritty sensation. Associated photophobia and some eye pain with looking medially. Denies any fever, periorbital swelling, blurry vision.  Does not wear contacts.    Past Medical History:  Diagnosis Date  . Pinched nerve    Pinched nerve in back    There are no problems to display for this patient.   History reviewed. No pertinent surgical history.     No family history on file.  Social History   Tobacco Use  . Smoking status: Current Some Day Smoker  . Smokeless tobacco: Current User    Types: Snuff  Vaping Use  . Vaping Use: Never used  Substance Use Topics  . Alcohol use: Yes  . Drug use: Yes    Types: Marijuana    Home Medications Prior to Admission medications   Medication Sig Start Date End Date Taking? Authorizing Provider  bismuth subsalicylate (PEPTO BISMOL) 262 MG/15ML suspension Take 30 mLs by mouth every 6 (six) hours as needed for indigestion.    [provider]  dicyclomine (BENTYL) 20 MG tablet Take 1 tablet (20 mg total) by mouth 2 (two) times daily. 04/25/19   Renne Crigler, PA-C    famotidine-calcium carbonate-magnesium hydroxide (PEPCID COMPLETE) 10-800-165 MG chewable tablet Chew 1 tablet by mouth daily as needed (nausea, vomiting).    [provider]  ibuprofen (ADVIL) 200 MG tablet Take 200 mg by mouth every 6 (six) hours as needed for moderate pain.    [provider]  omeprazole (PRILOSEC) 20 MG capsule Take 1 capsule (20 mg total) by mouth daily. 04/25/19   Renne Crigler, PA-C  ondansetron (ZOFRAN ODT) 4 MG disintegrating tablet Take 1 tablet (4 mg total) by mouth every 8 (eight) hours as needed for nausea or vomiting. 04/25/19   Renne Crigler, PA-C  sucralfate (CARAFATE) 1 g tablet Take 1 tablet (1 g total) by mouth 4 (four) times daily -  with meals and at bedtime. 04/25/19   Renne Crigler, PA-C  albuterol (PROVENTIL HFA;VENTOLIN HFA) 108 (90 BASE) MCG/ACT inhaler Inhale 1-2 puffs into the lungs every 6 (six) hours as needed for wheezing or shortness of breath. Patient not taking: Reported on 04/24/2019 09/24/14 04/25/19  Lorre Nick, MD  fluticasone [facility]) 50 MCG/ACT nasal spray Place 2 sprays into both nostrils daily. Patient not taking: Reported on 04/24/2019 02/22/17 04/25/19  Everlene Farrier, PA-C    Allergies    Patient has no known allergies.  Review of Systems   Review of Systems  Constitutional: Negative for chills and fever.  HENT: Negative for facial swelling.   Eyes: Positive for photophobia, pain and redness. Negative for itching and visual disturbance.  Respiratory: Negative for shortness of breath.   Cardiovascular: Negative for chest pain.  Gastrointestinal: Negative for nausea and vomiting.  Musculoskeletal: Negative for neck stiffness.  Skin: Negative for rash and wound.  Neurological: Negative for dizziness, light-headedness and headaches.    Physical Exam Updated Vital Signs BP (!) 151/98 (BP Location: Right Arm)   Pulse (!) 103   Temp 98.1 F (36.7 C) (Oral)   Resp 19   SpO2 97%   Physical Exam Constitutional:       Appearance: Normal appearance. He is not diaphoretic.     Comments: Appears uncomfortable, squinting right eye  HENT:     Head: Normocephalic and atraumatic.     Mouth/Throat:     Mouth: Mucous membranes are moist.  Eyes:     Comments: Pupils round, equal, and reactive.  Extraocular motion intact bilaterally, elicits mild pain with medial gaze on R.  Conjunctival injected on the right, generalized.  Excessive watery clear drainage from right eye.  No evidence of foreign body with eversion of both eyelids on R.  fluorescein eye exam on R showing few mm linear horizontal abrasion around medial 4-5 o'clock position.  Immediate resolution of pain with tetracaine drop insertion.  No periorbital swelling.  Pulmonary:     Effort: Pulmonary effort is normal.  Skin:    General: Skin is warm and dry.  Neurological:     General: No focal deficit present.     Mental Status: He is alert and oriented to person, place, and time.  Psychiatric:        Mood and Affect: Mood normal.        Behavior: Behavior normal.     Visual Acuity  Right Eye Distance: 20/30 Left Eye Distance: 20/40 Bilateral Distance: 20/40  Right Eye Near:   Left Eye Near:    Bilateral Near:    ED Results / Procedures / Treatments   Labs (all labs ordered are listed, but only abnormal results are displayed) Labs Reviewed - No data to display  EKG None  Radiology No results found.  Procedures Procedures (including critical care time)  Medications Ordered in ED Medications  fluorescein ophthalmic strip 1 strip (has no administration in time range)  tetracaine (PONTOCAINE) 0.5 % ophthalmic solution 1 drop (has no administration in time range)  erythromycin ophthalmic ointment (has no administration in time range)    ED Course  I have reviewed the triage vital signs and the nursing notes.  Pertinent labs & imaging results that were available during my care of the patient were reviewed by me and considered in my  medical decision making (see chart for details).    MDM Rules/Calculators/A&P                          34 year old gentleman presenting for evaluation of eye injury after his daughter scratched his eye.  Associated gritty sensation with excessive watery clear drainage, medial linear abrasion seen with fluorescein eye exam consistent with corneal abrasion.  No foreign body evident.  Reassuringly visual acuity appropriate and equal bilaterally with reactive pupils and intact extraocular motion.  Provided with erythromycin ointment course in the ED, rx'd BID x5 days.  May use lubricating eyedrops and cool compresses as well.  Tylenol/ibuprofen PRN. Follow-up with his ophthalmologist within the next week.  F/U sooner if symptoms not improving the next 2-3 days, visual changes, eye swelling, or fever.  Final Clinical Impression(s) / ED Diagnoses Final diagnoses:  Abrasion  of right cornea, initial encounter    Rx / DC Orders ED Discharge Orders    None       Allayne Stack, DO 01/13/20 1109    Gwyneth Sprout, MD 01/14/20 1018

## 2020-01-13 NOTE — Discharge Instructions (Addendum)
It was wonderful to meet you today!  You are diagnosed with a corneal abrasion.  Please make sure you use the erythromycin ointment twice daily for approximately 5 days.  You can also use your lubricating eye drops or get lubricating eye ointment over-the-counter in addition to the erythromycin.  Warm or cool compresses for 20 minutes at a time on your eye can also help.  Please use Tylenol 650 mg and/or ibuprofen 400 mg alternating each 1 every 3 hours for pain relief.  Follow-up with your ophthalmologist to ensure appropriate healing.  Follow-up sooner if pain not improving in the next 2-3 days, blurry vision, eye/eyelid swelling, or worsening redness of your eye.  Your blood pressure was also elevated while you are here, may be in the setting of pain.  Please follow-up with your primary care provider to ensure appropriate blood pressure.

## 2020-01-25 ENCOUNTER — Other Ambulatory Visit: Payer: Self-pay

## 2020-01-25 ENCOUNTER — Emergency Department (HOSPITAL_COMMUNITY)
Admission: EM | Admit: 2020-01-25 | Discharge: 2020-01-25 | Disposition: A | Discharge status: Home / Self Care | Payer: Managed Care, Other (non HMO) | Attending: Emergency Medicine | Admitting: Emergency Medicine

## 2020-01-25 ENCOUNTER — Emergency Department (HOSPITAL_COMMUNITY): Payer: Managed Care, Other (non HMO)

## 2020-01-25 DIAGNOSIS — R0600 Dyspnea, unspecified: Secondary | ICD-10-CM | POA: Insufficient documentation

## 2020-01-25 DIAGNOSIS — R0789 Other chest pain: Secondary | ICD-10-CM

## 2020-01-25 DIAGNOSIS — R079 Chest pain, unspecified: Secondary | ICD-10-CM | POA: Insufficient documentation

## 2020-01-25 DIAGNOSIS — F17210 Nicotine dependence, cigarettes, uncomplicated: Secondary | ICD-10-CM | POA: Insufficient documentation

## 2020-01-25 MED ORDER — IBUPROFEN 600 MG PO TABS
600.0000 mg | ORAL_TABLET | Freq: Four times a day (QID) | ORAL | 0 refills | Status: DC | PRN
Start: 2020-01-25 — End: 2020-01-25

## 2020-01-25 MED ORDER — IBUPROFEN 600 MG PO TABS
600.0000 mg | ORAL_TABLET | Freq: Four times a day (QID) | ORAL | 0 refills | Status: DC | PRN
Start: 2020-01-25 — End: 2022-05-08

## 2020-01-25 MED ORDER — IBUPROFEN 800 MG PO TABS
800.0000 mg | ORAL_TABLET | Freq: Once | ORAL | Status: AC
  Administered 2020-01-25: 800 mg via ORAL

## 2020-01-25 MED ORDER — METHOCARBAMOL 750 MG PO TABS
750.0000 mg | ORAL_TABLET | Freq: Four times a day (QID) | ORAL | 0 refills | Status: DC
Start: 2020-01-25 — End: 2022-05-08

## 2020-01-25 MED ORDER — METHOCARBAMOL 750 MG PO TABS
750.0000 mg | ORAL_TABLET | Freq: Four times a day (QID) | ORAL | 0 refills | Status: DC
Start: 2020-01-25 — End: 2020-01-25

## 2020-01-25 NOTE — ED Provider Notes (Signed)
[facility]-EMERGENCY DEPT Provider Note   CSN: [id number] Arrival date & time: 01/25/20  0750     History Chief Complaint  Patient presents with  . left flank pain    Keiondre Colee is a 34 y.o. male.  34 year old male presents with acute onset of last anterior chest pain which occurred after he sneezed.  States he has pinpoint tenderness characterizes sharp in his left anterior chest with associated dyspnea.  No diaphoresis or cardiac symptoms.  Pain is worse with movement and better remaining still.  No treatment use prior to arrival.        Past Medical History:  Diagnosis Date  . Pinched nerve    Pinched nerve in back    There are no problems to display for this patient.   No past surgical history on file.     No family history on file.  Social History   Tobacco Use  . Smoking status: Current Some Day Smoker  . Smokeless tobacco: Current User    Types: Snuff  Vaping Use  . Vaping Use: Never used  Substance Use Topics  . Alcohol use: Yes  . Drug use: Yes    Types: Marijuana    Home Medications Prior to Admission medications   Medication Sig Start Date End Date Taking? Authorizing Provider  bismuth subsalicylate (PEPTO BISMOL) 262 MG/15ML suspension Take 30 mLs by mouth every 6 (six) hours as needed for indigestion.    [provider]  dicyclomine (BENTYL) 20 MG tablet Take 1 tablet (20 mg total) by mouth 2 (two) times daily. 04/25/19   Renne Crigler, PA-C  famotidine-calcium carbonate-magnesium hydroxide (PEPCID COMPLETE) 10-800-165 MG chewable tablet Chew 1 tablet by mouth daily as needed (nausea, vomiting).    [provider]  ibuprofen (ADVIL) 200 MG tablet Take 200 mg by mouth every 6 (six) hours as needed for moderate pain.    [provider]  omeprazole (PRILOSEC) 20 MG capsule Take 1 capsule (20 mg total) by mouth daily. 04/25/19   Renne Crigler, PA-C  ondansetron (ZOFRAN ODT) 4 MG disintegrating  tablet Take 1 tablet (4 mg total) by mouth every 8 (eight) hours as needed for nausea or vomiting. 04/25/19   Renne Crigler, PA-C  sucralfate (CARAFATE) 1 g tablet Take 1 tablet (1 g total) by mouth 4 (four) times daily -  with meals and at bedtime. 04/25/19   Renne Crigler, PA-C  albuterol (PROVENTIL HFA;VENTOLIN HFA) 108 (90 BASE) MCG/ACT inhaler Inhale 1-2 puffs into the lungs every 6 (six) hours as needed for wheezing or shortness of breath. Patient not taking: Reported on 04/24/2019 09/24/14 04/25/19  Lorre Nick, MD  fluticasone [facility]) 50 MCG/ACT nasal spray Place 2 sprays into both nostrils daily. Patient not taking: Reported on 04/24/2019 02/22/17 04/25/19  Everlene Farrier, PA-C    Allergies    Patient has no known allergies.  Review of Systems   Review of Systems  All other systems reviewed and are negative.   Physical Exam Updated Vital Signs BP 129/86 (BP Location: Left Arm)   Pulse 93   Temp 98.1 F (36.7 C) (Oral)   Resp 17   Ht 1.702 m (5\' 7" )   Wt 122.5 kg   SpO2 96%   BMI 42.29 kg/m   Physical Exam Vitals and nursing note reviewed.  Constitutional:      General: He is not in acute distress.    Appearance: Normal appearance. He is well-developed. He is not toxic-appearing.  HENT:  Head: Normocephalic and atraumatic.  Eyes:     General: Lids are normal.     Conjunctiva/sclera: Conjunctivae normal.     Pupils: Pupils are equal, round, and reactive to light.  Neck:     Thyroid: No thyroid mass.     Trachea: No tracheal deviation.  Cardiovascular:     Rate and Rhythm: Normal rate and regular rhythm.     Heart sounds: Normal heart sounds. No murmur heard.  No gallop.   Pulmonary:     Effort: Pulmonary effort is normal. No respiratory distress.     Breath sounds: Normal breath sounds. No stridor. No decreased breath sounds, wheezing, rhonchi or rales.  Chest:     Chest wall: Tenderness present.    Abdominal:     General: Bowel sounds are normal. There  is no distension.     Palpations: Abdomen is soft.     Tenderness: There is no abdominal tenderness. There is no rebound.  Musculoskeletal:        General: No tenderness. Normal range of motion.     Cervical back: Normal range of motion and neck supple.  Skin:    General: Skin is warm and dry.     Findings: No abrasion or rash.  Neurological:     Mental Status: He is alert and oriented to person, place, and time.     GCS: GCS eye subscore is 4. GCS verbal subscore is 5. GCS motor subscore is 6.     Cranial Nerves: No cranial nerve deficit.     Sensory: No sensory deficit.  Psychiatric:        Speech: Speech normal.        Behavior: Behavior normal.     ED Results / Procedures / Treatments   Labs (all labs ordered are listed, but only abnormal results are displayed) Labs Reviewed - No data to display  EKG None  Radiology No results found.  Procedures Procedures (including critical care time)  Medications Ordered in ED Medications  ibuprofen (ADVIL) tablet 800 mg (has no administration in time range)    ED Course  I have reviewed the triage vital signs and the nursing notes.  Pertinent labs & imaging results that were available during my care of the patient were reviewed by me and considered in my medical decision making (see chart for details).    MDM Rules/Calculators/A&P                          Chest x-ray negative for pneumothorax.  Medicated with Motrin and feels better.  Will prescribe muscle relaxants and anti-inflammatories and discharged home Final Clinical Impression(s) / ED Diagnoses Final diagnoses:  None    Rx / DC Orders ED Discharge Orders    None       Lorre Nick, MD 01/25/20 1023

## 2020-01-25 NOTE — ED Triage Notes (Signed)
Patient reports left flank pain starting yesterday after he sneezed several times. States he heard a crack or a pop.

## 2021-01-06 ENCOUNTER — Emergency Department (HOSPITAL_COMMUNITY)
Admission: EM | Admit: 2021-01-06 | Discharge: 2021-01-06 | Disposition: A | Discharge status: Home / Self Care | Payer: Managed Care, Other (non HMO) | Attending: Emergency Medicine | Admitting: Emergency Medicine

## 2021-01-06 ENCOUNTER — Other Ambulatory Visit: Payer: Self-pay

## 2021-01-06 ENCOUNTER — Emergency Department (HOSPITAL_COMMUNITY): Payer: Managed Care, Other (non HMO)

## 2021-01-06 ENCOUNTER — Encounter (HOSPITAL_COMMUNITY): Payer: Self-pay

## 2021-01-06 DIAGNOSIS — J101 Influenza due to other identified influenza virus with other respiratory manifestations: Secondary | ICD-10-CM | POA: Diagnosis not present

## 2021-01-06 DIAGNOSIS — R109 Unspecified abdominal pain: Secondary | ICD-10-CM | POA: Diagnosis not present

## 2021-01-06 DIAGNOSIS — Z20822 Contact with and (suspected) exposure to covid-19: Secondary | ICD-10-CM | POA: Insufficient documentation

## 2021-01-06 DIAGNOSIS — R000 Tachycardia, unspecified: Secondary | ICD-10-CM | POA: Diagnosis not present

## 2021-01-06 DIAGNOSIS — F17200 Nicotine dependence, unspecified, uncomplicated: Secondary | ICD-10-CM | POA: Diagnosis not present

## 2021-01-06 DIAGNOSIS — M545 Low back pain, unspecified: Secondary | ICD-10-CM | POA: Insufficient documentation

## 2021-01-06 DIAGNOSIS — M549 Dorsalgia, unspecified: Secondary | ICD-10-CM | POA: Diagnosis present

## 2021-01-06 LAB — CBC WITH DIFFERENTIAL/PLATELET
Abs Immature Granulocytes: 0.03 10*3/uL (ref 0.00–0.07)
Basophils Absolute: 0 10*3/uL (ref 0.0–0.1)
Basophils Relative: 1 %
Eosinophils Absolute: 0.1 10*3/uL (ref 0.0–0.5)
Eosinophils Relative: 1 %
HCT: 48.3 % (ref 39.0–52.0)
Hemoglobin: 16.1 g/dL (ref 13.0–17.0)
Immature Granulocytes: 1 %
Lymphocytes Relative: 9 %
Lymphs Abs: 0.5 10*3/uL — ABNORMAL LOW (ref 0.7–4.0)
MCH: 31 pg (ref 26.0–34.0)
MCHC: 33.3 g/dL (ref 30.0–36.0)
MCV: 93.1 fL (ref 80.0–100.0)
Monocytes Absolute: 0.6 10*3/uL (ref 0.1–1.0)
Monocytes Relative: 10 %
Neutro Abs: 4.8 10*3/uL (ref 1.7–7.7)
Neutrophils Relative %: 78 %
Platelets: 160 10*3/uL (ref 150–400)
RBC: 5.19 MIL/uL (ref 4.22–5.81)
RDW: 12.3 % (ref 11.5–15.5)
WBC: 6.1 10*3/uL (ref 4.0–10.5)
nRBC: 0 % (ref 0.0–0.2)

## 2021-01-06 LAB — COMPREHENSIVE METABOLIC PANEL
ALT: 52 U/L — ABNORMAL HIGH (ref 0–44)
AST: 123 U/L — ABNORMAL HIGH (ref 15–41)
Albumin: 4.4 g/dL (ref 3.5–5.0)
Alkaline Phosphatase: 116 U/L (ref 38–126)
Anion gap: 9 (ref 5–15)
BUN: 5 mg/dL — ABNORMAL LOW (ref 6–20)
CO2: 24 mmol/L (ref 22–32)
Calcium: 9.2 mg/dL (ref 8.9–10.3)
Chloride: 103 mmol/L (ref 98–111)
Creatinine, Ser: 0.9 mg/dL (ref 0.61–1.24)
GFR, Estimated: >60 mL/min (ref >60)
Glucose, Bld: 101 mg/dL — ABNORMAL HIGH (ref 70–99)
Potassium: 4.1 mmol/L (ref 3.5–5.1)
Sodium: 136 mmol/L (ref 135–145)
Total Bilirubin: 0.9 mg/dL (ref 0.3–1.2)
Total Protein: 8.2 g/dL — ABNORMAL HIGH (ref 6.5–8.1)

## 2021-01-06 LAB — URINALYSIS, ROUTINE W REFLEX MICROSCOPIC
Glucose, UA: NEGATIVE mg/dL
Hgb urine dipstick: NEGATIVE
Ketones, ur: 15 mg/dL — AB
Leukocytes,Ua: NEGATIVE
Nitrite: NEGATIVE
Protein, ur: 30 mg/dL — AB
Specific Gravity, Urine: 1.025 (ref 1.005–1.030)
pH: 6.5 (ref 5.0–8.0)

## 2021-01-06 LAB — URINALYSIS, MICROSCOPIC (REFLEX)
Bacteria, UA: NONE SEEN
Squamous Epithelial / HPF: NONE SEEN (ref 0–5)

## 2021-01-06 LAB — RESP PANEL BY RT-PCR (FLU A&B, COVID) ARPGX2
Influenza A by PCR: POSITIVE — AB
Influenza B by PCR: NEGATIVE
SARS Coronavirus 2 by RT PCR: NEGATIVE

## 2021-01-06 MED ORDER — FENTANYL CITRATE (PF) 100 MCG/2ML IJ SOLN
75.0000 ug | Freq: Once | INTRAMUSCULAR | Status: AC
Start: 2021-01-06 — End: 2021-01-06
  Administered 2021-01-06: 75 ug via INTRAVENOUS
  Filled 2021-01-06: qty 2

## 2021-01-06 MED ORDER — SODIUM CHLORIDE 0.9 % IV BOLUS
1000.0000 mL | Freq: Once | INTRAVENOUS | Status: AC
  Administered 2021-01-06: 1000 mL via INTRAVENOUS

## 2021-01-06 MED ORDER — OSELTAMIVIR PHOSPHATE 75 MG PO CAPS
75.0000 mg | ORAL_CAPSULE | Freq: Once | ORAL | Status: AC
  Administered 2021-01-06: 75 mg via ORAL
  Filled 2021-01-06 (×2): qty 1

## 2021-01-06 MED ORDER — OSELTAMIVIR PHOSPHATE 75 MG PO CAPS: 75.0000 mg | ORAL_CAPSULE | Freq: Two times a day (BID) | ORAL | Status: DC

## 2021-01-06 MED ORDER — CYCLOBENZAPRINE HCL 10 MG PO TABS: 10.0000 mg | ORAL_TABLET | Freq: Two times a day (BID) | ORAL | 0 refills | Status: DC | PRN

## 2021-01-06 MED ORDER — FENTANYL CITRATE (PF) 100 MCG/2ML IJ SOLN
50.0000 ug | Freq: Once | INTRAMUSCULAR | Status: AC
  Administered 2021-01-06: 50 ug via INTRAVENOUS
  Filled 2021-01-06: qty 2

## 2021-01-06 MED ORDER — ACETAMINOPHEN 325 MG PO TABS
650.0000 mg | ORAL_TABLET | Freq: Once | ORAL | Status: AC
  Administered 2021-01-06: 650 mg via ORAL
  Filled 2021-01-06: qty 2

## 2021-01-06 MED ORDER — OSELTAMIVIR PHOSPHATE 75 MG PO CAPS: 75.0000 mg | ORAL_CAPSULE | Freq: Two times a day (BID) | ORAL | 0 refills | Status: DC

## 2021-01-06 MED ORDER — KETOROLAC TROMETHAMINE 30 MG/ML IJ SOLN
30.0000 mg | Freq: Once | INTRAMUSCULAR | Status: AC
  Administered 2021-01-06: 30 mg via INTRAVENOUS
  Filled 2021-01-06: qty 1

## 2021-01-06 MED ORDER — OSELTAMIVIR PHOSPHATE 75 MG PO CAPS: 75.0000 mg | ORAL_CAPSULE | Freq: Two times a day (BID) | ORAL | 0 refills | Status: AC

## 2021-01-06 NOTE — ED Provider Notes (Signed)
[facility] DEPT Provider Note   CSN: [id number] Arrival date & time: 01/06/21  1730     History Chief Complaint  Patient presents with   Flank Pain    [facility] is a 35 y.o. male.  HPI  Patient with no significant medical history presents to the emergency department with chief complaint of bilateral back pain.  Patient states pain started on Saturday and has progressed and gotten worse.  Patient states pain came on suddenly, states the pain radiates down his leg and up his back, states is worse on the left versus the right, pain is exacerbated with movement. he endorses decreased urine output but denies hematuria, dysuria, testicular pain, penile discharge.  He denies any recent trauma to the area, has no history of kidney stones or UTIs, he denies history of IV drug use.  He does endorse that his daughter recently had the flu, proximally 5 days ago and is just getting over it, he is not vaccine against COVID or influenza, he he denies nasal congestion, sore throat, cough, stomach pain, nausea, vomiting, but admits to fever, chills, diarrhea that started today.  Patient denies history of significant abdominal history, he denies alleviating factors.  He is tolerating p.o., patient denies headaches, fevers, chills, shortness of breath, chest pain, abdominal pain.  Past Medical History:  Diagnosis Date   Pinched nerve    Pinched nerve in back    There are no problems to display for this patient.   History reviewed. No pertinent surgical history.     History reviewed. No pertinent family history.  Social History   Tobacco Use   Smoking status: Some Days    Pack years: 0.00   Smokeless tobacco: Current    Types: Snuff  Vaping Use   Vaping Use: Never used  Substance Use Topics   Alcohol use: Yes   Drug use: Yes    Types: Marijuana    Home Medications Prior to Admission medications   Medication Sig Start Date End Date Taking?  Authorizing Provider  cyclobenzaprine (FLEXERIL) 10 MG tablet Take 1 tablet (10 mg total) by mouth 2 (two) times daily as needed for muscle spasms. 01/06/21  Yes Marcello Fennel, PA-C  oseltamivir (TAMIFLU) 75 MG capsule Take 1 capsule (75 mg total) by mouth 2 (two) times daily for 4 days. 01/06/21 01/10/21 Yes Marcello Fennel, PA-C  bismuth subsalicylate (PEPTO BISMOL) 262 MG/15ML suspension Take 30 mLs by mouth every 6 (six) hours as needed for indigestion.    [provider]  dicyclomine (BENTYL) 20 MG tablet Take 1 tablet (20 mg total) by mouth 2 (two) times daily. 04/25/19   Carlisle Cater, PA-C  famotidine-calcium carbonate-magnesium hydroxide (PEPCID COMPLETE) 10-800-165 MG chewable tablet Chew 1 tablet by mouth daily as needed (nausea, vomiting).    [provider]  ibuprofen (ADVIL) 200 MG tablet Take 200 mg by mouth every 6 (six) hours as needed for moderate pain.    [provider]  ibuprofen (ADVIL) 600 MG tablet Take 1 tablet (600 mg total) by mouth every 6 (six) hours as needed. 01/25/20   Lacretia Leigh, MD  methocarbamol (ROBAXIN-750) 750 MG tablet Take 1 tablet (750 mg total) by mouth 4 (four) times daily. 01/25/20   Lacretia Leigh, MD  omeprazole (PRILOSEC) 20 MG capsule Take 1 capsule (20 mg total) by mouth daily. 04/25/19   Carlisle Cater, PA-C  ondansetron (ZOFRAN ODT) 4 MG disintegrating tablet Take 1 tablet (4 mg total) by mouth every  8 (eight) hours as needed for nausea or vomiting. 04/25/19   Carlisle Cater, PA-C  sucralfate (CARAFATE) 1 g tablet Take 1 tablet (1 g total) by mouth 4 (four) times daily -  with meals and at bedtime. 04/25/19   Carlisle Cater, PA-C  albuterol (PROVENTIL HFA;VENTOLIN HFA) 108 (90 BASE) MCG/ACT inhaler Inhale 1-2 puffs into the lungs every 6 (six) hours as needed for wheezing or shortness of breath. Patient not taking: Reported on 04/24/2019 09/24/14 04/25/19  Lacretia Leigh, MD  fluticasone [facility]) 50 MCG/ACT nasal spray Place  2 sprays into both nostrils daily. Patient not taking: Reported on 04/24/2019 02/22/17 04/25/19  Waynetta Pean, PA-C    Allergies    Patient has no known allergies.  Review of Systems   Review of Systems  Constitutional:  Positive for chills and fever.  HENT:  Negative for congestion.   Respiratory:  Negative for shortness of breath.   Cardiovascular:  Negative for chest pain.  Gastrointestinal:  Positive for diarrhea. Negative for abdominal pain, nausea and vomiting.  Genitourinary:  Positive for difficulty urinating and flank pain. Negative for dysuria, enuresis, frequency, penile discharge and testicular pain.  Musculoskeletal:  Positive for back pain.  Skin:  Negative for rash.  Neurological:  Negative for dizziness and headaches.  Hematological:  Does not bruise/bleed easily.   Physical Exam Updated Vital Signs BP (!) 149/107   Pulse (!) 115   Temp (!) 100.5 F (38.1 C) (Oral)   Resp 16   SpO2 96%   Physical Exam Vitals and nursing note reviewed.  Constitutional:      General: He is not in acute distress.    Appearance: He is not ill-appearing.  HENT:     Head: Normocephalic and atraumatic.     Nose: Congestion present.  Eyes:     Conjunctiva/sclera: Conjunctivae normal.  Cardiovascular:     Rate and Rhythm: Regular rhythm. Tachycardia present.     Pulses: Normal pulses.     Heart sounds: No murmur heard.   No friction rub. No gallop.  Pulmonary:     Effort: No respiratory distress.     Breath sounds: No wheezing, rhonchi or rales.  Abdominal:     Palpations: Abdomen is soft.     Tenderness: There is abdominal tenderness. There is no right CVA tenderness or left CVA tenderness.     Comments: Patient's abdomen was palpated there was slightly tender to palpation in his left side into his left flank, there is no guarding, rebound tenderness, peritoneal sign, negative Murphy sign or McBurney point.  Positive positive CVA tenderness on the left side.  Musculoskeletal:      Right lower leg: No edema.     Left lower leg: No edema.     Comments: Patient spine was palpated nontender to palpation, he had noted tenderness along his sacrum bilaterally, mostly within his musculature, he had full range of motion at his toes ankle knee, neurovascular fully intact, positive straight leg raise bilaterally.  Skin:    General: Skin is warm and dry.  Neurological:     Mental Status: He is alert.  Psychiatric:        Mood and Affect: Mood normal.    ED Results / Procedures / Treatments   Labs (all labs ordered are listed, but only abnormal results are displayed) Labs Reviewed  RESP PANEL BY RT-PCR (FLU A&B, COVID) ARPGX2 - Abnormal; Notable for the following components:      Result Value   Influenza A by  PCR POSITIVE (*)    All other components within normal limits  COMPREHENSIVE METABOLIC PANEL - Abnormal; Notable for the following components:   Glucose, Bld 101 (*)    BUN 5 (*)    Total Protein 8.2 (*)    AST 123 (*)    ALT 52 (*)    All other components within normal limits  CBC WITH DIFFERENTIAL/PLATELET - Abnormal; Notable for the following components:   Lymphs Abs 0.5 (*)    All other components within normal limits  URINALYSIS, ROUTINE W REFLEX MICROSCOPIC - Abnormal; Notable for the following components:   Bilirubin Urine SMALL (*)    Ketones, ur 15 (*)    Protein, ur 30 (*)    All other components within normal limits  URINALYSIS, MICROSCOPIC (REFLEX)    EKG None  Radiology DG Chest Port 1 View  Result Date: 01/06/2021 CLINICAL DATA:  Cough.  Left flank pain. EXAM: PORTABLE CHEST 1 VIEW COMPARISON:  01/25/2020 FINDINGS: The heart size and mediastinal contours are within normal limits. Both lungs are clear. The visualized skeletal structures are unremarkable. IMPRESSION: No active disease. Electronically Signed   By: Lucienne Capers M.D.   On: 01/06/2021 18:44   CT Renal Stone Study  Result Date: 01/06/2021 CLINICAL DATA:  35 year old male  with flank pain. Concern for kidney stone. EXAM: CT ABDOMEN AND PELVIS WITHOUT CONTRAST TECHNIQUE: Multidetector CT imaging of the abdomen and pelvis was performed following the standard protocol without IV contrast. COMPARISON:  CT abdomen pelvis dated 04/24/2019. FINDINGS: Evaluation of this exam is limited in the absence of intravenous contrast. Lower chest: The visualized lung bases are clear. No intra-abdominal free air or free fluid. Hepatobiliary: Severe fatty liver. No intrahepatic biliary ductal dilatation. Gallbladder is unremarkable. Pancreas: Unremarkable. No pancreatic ductal dilatation or surrounding inflammatory changes. Spleen: Normal in size without focal abnormality. Adrenals/Urinary Tract: The adrenal glands are unremarkable. The kidneys, visualized ureters, and urinary bladder appear unremarkable. Stomach/Bowel: There is no bowel obstruction or active inflammation. The appendix is normal. Vascular/Lymphatic: The abdominal aorta and IVC unremarkable. No portal venous gas. There is no adenopathy. Reproductive: The prostate and seminal vesicles are grossly unremarkable. No pelvic mass. Other: None Musculoskeletal: No acute or significant osseous findings. IMPRESSION: 1. No acute intra-abdominal or pelvic pathology. No hydronephrosis or nephrolithiasis. 2. Fatty liver. Electronically Signed   By: Anner Crete M.D.   On: 01/06/2021 20:42    Procedures Procedures   Medications Ordered in ED Medications  oseltamivir (TAMIFLU) capsule 75 mg (has no administration in time range)  oseltamivir (TAMIFLU) capsule 75 mg (has no administration in time range)  acetaminophen (TYLENOL) tablet 650 mg (650 mg Oral Given 01/06/21 1825)  sodium chloride 0.9 % bolus 1,000 mL (1,000 mLs Intravenous New Bag/Given (Non-Interop) 01/06/21 1823)  fentaNYL (SUBLIMAZE) injection 75 mcg (75 mcg Intravenous Given 01/06/21 1823)  ketorolac (TORADOL) 30 MG/ML injection 30 mg (30 mg Intravenous Given 01/06/21 2039)   fentaNYL (SUBLIMAZE) injection 50 mcg (50 mcg Intravenous Given 01/06/21 2039)    ED Course  I have reviewed the triage vital signs and the nursing notes.  Pertinent labs & imaging results that were available during my care of the patient were reviewed by me and considered in my medical decision making (see chart for details).    MDM Rules/Calculators/A&P                         Initial impression-patient presents with back pain.  He is  alert, does not appear to be acute distress, vital signs noted for tachycardia and a fever of 100.5.  I suspect patient suffering from a viral infection with a muscular strain of the back.  Will obtain basic lab work-up, provide patient fluids, antiemetics and reassess.  Work-up-CBC unremarkable, CMP shows slight hyperglycemia 101, liver enzymes AST and ALT slightly elevated above their baseline.  Respiratory panel positive for influenza A.  UA negative for nitrates, leukocytes, hematuria, CT renal negative for acute findings.  Chest x-ray negative for acute findings.  Reassessment-patient was reassessed, states he is feeling better, vital signs have improved.  Does endorse that he is still having slight lower back pain.  Will provide patient with additional dose of fentanyl, and Toradol.  I suspect pain is secondary due to muscular strain exacerbated by influenza.  will proceed with renal study as patient been having pain for last 4 days possible to have normal UA and still have a kidney stone.  Patient was reassessed, updated on lab and imaging, patient has no complaints this time, states he is feeling much better, patient agreed for discharge.  Rule out-I have low suspicion for systemic infection as patient is nontoxic-appearing, vital signs reassuring.  On exam patient initially had tachycardia and fever I suspect this secondary due to influenza this has since resolved after fluids and Tylenol.  I have low suspicion for UTI, pyelonephritis, kidney stone as UA  is negative for nitrates leukocytes or hematuria, CT renal negative for erythema around the kidneys or stones.  Low suspicion for liver or gallbladder abnormality as patient has no upper quadrant pain, liver enzymes alk phos are at patient's baseline.  Low suspicion for AAA as patient has low risk factors, he has no history of this, presentation is atypical as pain is worsened with leg movement.  Plan-  Lower back pain and left flank pain-I suspect patient suffering from a muscular strain which was exacerbated by influenza A.  will start him on antivirals symptoms started within 48 hours, he is at high risk as he is not on vaccinated.  Will provide patient with a muscle laxer for back pain.  Follow-up with PCP as needed.  Vital signs have remained stable, no indication for hospital admission.   Patient given at home care as well strict return precautions.  Patient verbalized that they understood agreed to said plan.  Final Clinical Impression(s) / ED Diagnoses Final diagnoses:  Influenza A  Acute bilateral low back pain without sciatica    Rx / DC Orders ED Discharge Orders          Ordered    oseltamivir (TAMIFLU) 75 MG capsule  2 times daily        01/06/21 2128    cyclobenzaprine (FLEXERIL) 10 MG tablet  2 times daily PRN        01/06/21 2128             Aron Baba 01/06/21 2131    Lacretia Leigh, MD 01/06/21 2252

## 2021-01-06 NOTE — ED Triage Notes (Signed)
Pt reports left flank pain that radiates to groin and down leg. Pt reports decreased urine output, but denies hematuria.

## 2021-01-06 NOTE — Discharge Instructions (Addendum)
Lab work and imaging are reassuring.  You have tested positive for influenza, I have started you on an antiviral please take as prescribed.  I suspect your lower back pain is from a muscle strain, most likely worsen with your viral infection.  I gave you a muscle relaxer please take as prescribed.  Please beaware this medication make you drowsy do not consume alcohol or operate heavy machinery when taking this medication.  I recommend Tylenol for fever control, ibuprofen for pain control, please stay hydrated.  Please follow-up with your PCP as needed.  Come back to the emergency department if you develop chest pain, shortness of breath, severe abdominal pain, uncontrolled nausea, vomiting, diarrhea.

## 2021-03-28 ENCOUNTER — Encounter (HOSPITAL_COMMUNITY): Payer: Self-pay

## 2021-03-28 ENCOUNTER — Emergency Department (HOSPITAL_COMMUNITY): Payer: Managed Care, Other (non HMO)

## 2021-03-28 ENCOUNTER — Other Ambulatory Visit: Payer: Self-pay

## 2021-03-28 ENCOUNTER — Emergency Department (HOSPITAL_COMMUNITY)
Admission: EM | Admit: 2021-03-28 | Discharge: 2021-03-28 | Disposition: A | Discharge status: Home / Self Care | Payer: Managed Care, Other (non HMO) | Attending: Emergency Medicine | Admitting: Emergency Medicine

## 2021-03-28 DIAGNOSIS — R112 Nausea with vomiting, unspecified: Secondary | ICD-10-CM

## 2021-03-28 DIAGNOSIS — F17200 Nicotine dependence, unspecified, uncomplicated: Secondary | ICD-10-CM | POA: Insufficient documentation

## 2021-03-28 DIAGNOSIS — R0602 Shortness of breath: Secondary | ICD-10-CM | POA: Diagnosis present

## 2021-03-28 DIAGNOSIS — E1169 Type 2 diabetes mellitus with other specified complication: Secondary | ICD-10-CM

## 2021-03-28 DIAGNOSIS — R197 Diarrhea, unspecified: Secondary | ICD-10-CM

## 2021-03-28 DIAGNOSIS — E1165 Type 2 diabetes mellitus with hyperglycemia: Secondary | ICD-10-CM | POA: Diagnosis not present

## 2021-03-28 DIAGNOSIS — R1110 Vomiting, unspecified: Secondary | ICD-10-CM | POA: Diagnosis not present

## 2021-03-28 DIAGNOSIS — R000 Tachycardia, unspecified: Secondary | ICD-10-CM | POA: Diagnosis not present

## 2021-03-28 DIAGNOSIS — U071 COVID-19: Secondary | ICD-10-CM | POA: Diagnosis not present

## 2021-03-28 LAB — COMPREHENSIVE METABOLIC PANEL
ALT: 58 U/L — ABNORMAL HIGH (ref 0–44)
AST: 173 U/L — ABNORMAL HIGH (ref 15–41)
Albumin: 4 g/dL (ref 3.5–5.0)
Alkaline Phosphatase: 78 U/L (ref 38–126)
Anion gap: 10 (ref 5–15)
BUN: 6 mg/dL (ref 6–20)
CO2: 21 mmol/L — ABNORMAL LOW (ref 22–32)
Calcium: 8.9 mg/dL (ref 8.9–10.3)
Chloride: 102 mmol/L (ref 98–111)
Creatinine, Ser: 0.89 mg/dL (ref 0.61–1.24)
GFR, Estimated: >60 mL/min (ref >60)
Glucose, Bld: 233 mg/dL — ABNORMAL HIGH (ref 70–99)
Potassium: 3.4 mmol/L — ABNORMAL LOW (ref 3.5–5.1)
Sodium: 133 mmol/L — ABNORMAL LOW (ref 135–145)
Total Bilirubin: 1.1 mg/dL (ref 0.3–1.2)
Total Protein: 8 g/dL (ref 6.5–8.1)

## 2021-03-28 LAB — CBC WITH DIFFERENTIAL/PLATELET
Abs Immature Granulocytes: 0.04 10*3/uL (ref 0.00–0.07)
Basophils Absolute: 0 10*3/uL (ref 0.0–0.1)
Basophils Relative: 1 %
Eosinophils Absolute: 0 10*3/uL (ref 0.0–0.5)
Eosinophils Relative: 0 %
HCT: 44.1 % (ref 39.0–52.0)
Hemoglobin: 15.3 g/dL (ref 13.0–17.0)
Immature Granulocytes: 1 %
Lymphocytes Relative: 12 %
Lymphs Abs: 0.7 10*3/uL (ref 0.7–4.0)
MCH: 32.1 pg (ref 26.0–34.0)
MCHC: 34.7 g/dL (ref 30.0–36.0)
MCV: 92.5 fL (ref 80.0–100.0)
Monocytes Absolute: 1 10*3/uL (ref 0.1–1.0)
Monocytes Relative: 15 %
Neutro Abs: 4.5 10*3/uL (ref 1.7–7.7)
Neutrophils Relative %: 71 %
Platelets: 145 10*3/uL — ABNORMAL LOW (ref 150–400)
RBC: 4.77 MIL/uL (ref 4.22–5.81)
RDW: 12.6 % (ref 11.5–15.5)
WBC: 6.3 10*3/uL (ref 4.0–10.5)
nRBC: 0 % (ref 0.0–0.2)

## 2021-03-28 LAB — LIPASE, BLOOD: Lipase: 31 U/L (ref 11–51)

## 2021-03-28 LAB — RESP PANEL BY RT-PCR (FLU A&B, COVID) ARPGX2
Influenza A by PCR: NEGATIVE
Influenza B by PCR: NEGATIVE
SARS Coronavirus 2 by RT PCR: POSITIVE — AB

## 2021-03-28 LAB — POC OCCULT BLOOD, ED: Fecal Occult Bld: NEGATIVE

## 2021-03-28 MED ORDER — METFORMIN HCL 500 MG PO TABS: 500.0000 mg | ORAL_TABLET | Freq: Two times a day (BID) | ORAL | 2 refills | Status: DC

## 2021-03-28 MED ORDER — OMEPRAZOLE 20 MG PO CPDR: 20.0000 mg | DELAYED_RELEASE_CAPSULE | Freq: Every day | ORAL | 0 refills | Status: DC

## 2021-03-28 MED ORDER — BENZONATATE 100 MG PO CAPS
100.0000 mg | ORAL_CAPSULE | Freq: Once | ORAL | Status: AC
  Administered 2021-03-28: 100 mg via ORAL
  Filled 2021-03-28: qty 1

## 2021-03-28 MED ORDER — SODIUM CHLORIDE 0.9 % IV BOLUS
1000.0000 mL | Freq: Once | INTRAVENOUS | Status: AC
  Administered 2021-03-28: 1000 mL via INTRAVENOUS

## 2021-03-28 MED ORDER — ONDANSETRON HCL 4 MG/2ML IJ SOLN
4.0000 mg | Freq: Once | INTRAMUSCULAR | Status: AC
  Administered 2021-03-28: 4 mg via INTRAVENOUS
  Filled 2021-03-28: qty 2

## 2021-03-28 MED ORDER — PANTOPRAZOLE SODIUM 40 MG IV SOLR
40.0000 mg | Freq: Once | INTRAVENOUS | Status: AC
  Administered 2021-03-28: 40 mg via INTRAVENOUS
  Filled 2021-03-28: qty 40

## 2021-03-28 MED ORDER — ACETAMINOPHEN 325 MG PO TABS
650.0000 mg | ORAL_TABLET | Freq: Once | ORAL | Status: AC
  Administered 2021-03-28: 650 mg via ORAL
  Filled 2021-03-28: qty 2

## 2021-03-28 MED ORDER — ONDANSETRON 4 MG PO TBDP: 4.0000 mg | ORAL_TABLET | Freq: Three times a day (TID) | ORAL | 0 refills | Status: DC | PRN

## 2021-03-28 MED ORDER — IOHEXOL 350 MG/ML SOLN
80.0000 mL | Freq: Once | INTRAVENOUS | Status: AC | PRN
  Administered 2021-03-28: 80 mL via INTRAVENOUS

## 2021-03-28 MED ORDER — BENZONATATE 100 MG PO CAPS: 100.0000 mg | ORAL_CAPSULE | Freq: Three times a day (TID) | ORAL | 0 refills | Status: DC

## 2021-03-28 NOTE — ED Notes (Signed)
O2 at 2L given, per pt request. Pt 95-97% on r/a.

## 2021-03-28 NOTE — ED Notes (Signed)
Pt discharged. Instructions and prescriptions given. AAOX4. Pt in no apparent distress with mild pain. The opportunity to ask questions was provided.  

## 2021-03-28 NOTE — Discharge Instructions (Addendum)
Your lab work today was notable for signs of diabetes.  Please take metformin twice daily, this can cause some diarrhea but it gets better with time.  Please also schedule a follow-up appoint with a primary care provider, information is provided. You tested positive for COVID, you will need to quarantine at home for the next 5 days.  After that you can return to work if you are feeling better but wear a mask for 5 more days.  Can also take Tessalon Perles every 8 hours as needed to help with cough suppressant.  Take Tylenol Motrin for body aches and fever. If you continue feel nauseated feel free to take Zofran every 6 hours as needed, dissolves under your tongue. Return if worse

## 2021-03-28 NOTE — ED Triage Notes (Signed)
Pt complains of diarrhea, vomiting, and shortness of breath since Sunday morning. Pt states that he is unable to keep anything down.

## 2021-03-28 NOTE — ED Provider Notes (Signed)
[facility]-EMERGENCY DEPT Provider Note   CSN: [id number] Arrival date & time: 03/28/21  0536     History Chief Complaint  Patient presents with   Diarrhea   Emesis   Shortness of Breath    Mark Santos is a 35 y.o. male.   Diarrhea Associated symptoms: abdominal pain, myalgias and vomiting   Associated symptoms: no fever and no headaches   Emesis Associated symptoms: abdominal pain, cough, diarrhea and myalgias   Associated symptoms: no fever and no headaches   Shortness of Breath Associated symptoms: abdominal pain, cough and vomiting   Associated symptoms: no chest pain, no fever and no headaches    Patient presents with emesis x3 days.  States that after eating a meal at his in-laws house he started feeling sick.  He felt congested and started having multiple episodes of emesis up to 10 daily.  The last 2 days he has also been having multiple episodes of diarrhea.  He states he has had multiple episodes of hematemesis with bright red blood and also having episodes of dark tarry stool for the last 2 days.  He does have some diffuse abdominal pain and associated shortness of breath.  The shortness of breath is worse when he is coughing, he states he has been feeling congested as well.  No history of asthma or COPD, his wife is also sick at home.  No previous abdominal surgeries, does not take any medicine for reflux disease, no history of ulcers, no significant anti-inflammatory use.  Drinks 6-7 beers nightly.   Past Medical History:  Diagnosis Date   Pinched nerve    Pinched nerve in back    There are no problems to display for this patient.   History reviewed. No pertinent surgical history.     History reviewed. No pertinent family history.  Social History   Tobacco Use   Smoking status: Some Days   Smokeless tobacco: Current    Types: Snuff  Vaping Use   Vaping Use: Never used  Substance Use Topics   Alcohol use: Yes   Drug use:  Yes    Types: Marijuana    Home Medications Prior to Admission medications   Medication Sig Start Date End Date Taking? Authorizing Provider  bismuth subsalicylate (PEPTO BISMOL) 262 MG/15ML suspension Take 30 mLs by mouth every 6 (six) hours as needed for indigestion.    [provider]  cyclobenzaprine (FLEXERIL) 10 MG tablet Take 1 tablet (10 mg total) by mouth 2 (two) times daily as needed for muscle spasms. 01/06/21   Carroll Jesika Men, PA-C  dicyclomine (BENTYL) 20 MG tablet Take 1 tablet (20 mg total) by mouth 2 (two) times daily. 04/25/19   Renne Crigler, PA-C  famotidine-calcium carbonate-magnesium hydroxide (PEPCID COMPLETE) 10-800-165 MG chewable tablet Chew 1 tablet by mouth daily as needed (nausea, vomiting).    [provider]  ibuprofen (ADVIL) 200 MG tablet Take 200 mg by mouth every 6 (six) hours as needed for moderate pain.    [provider]  ibuprofen (ADVIL) 600 MG tablet Take 1 tablet (600 mg total) by mouth every 6 (six) hours as needed. 01/25/20   Lorre Nick, MD  methocarbamol (ROBAXIN-750) 750 MG tablet Take 1 tablet (750 mg total) by mouth 4 (four) times daily. 01/25/20   Lorre Nick, MD  omeprazole (PRILOSEC) 20 MG capsule Take 1 capsule (20 mg total) by mouth daily. 04/25/19   Renne Crigler, PA-C  ondansetron (ZOFRAN ODT) 4 MG disintegrating tablet Take  1 tablet (4 mg total) by mouth every 8 (eight) hours as needed for nausea or vomiting. 04/25/19   Renne Crigler, PA-C  sucralfate (CARAFATE) 1 g tablet Take 1 tablet (1 g total) by mouth 4 (four) times daily -  with meals and at bedtime. 04/25/19   Renne Crigler, PA-C  albuterol (PROVENTIL HFA;VENTOLIN HFA) 108 (90 BASE) MCG/ACT inhaler Inhale 1-2 puffs into the lungs every 6 (six) hours as needed for wheezing or shortness of breath. Patient not taking: Reported on 04/24/2019 09/24/14 04/25/19  Lorre Nick, MD  fluticasone [facility]) 50 MCG/ACT nasal spray Place 2 sprays into both nostrils  daily. Patient not taking: Reported on 04/24/2019 02/22/17 04/25/19  Everlene Farrier, PA-C    Allergies    Patient has no known allergies.  Review of Systems   Review of Systems  Constitutional:  Negative for fever.  HENT:  Positive for congestion.   Respiratory:  Positive for cough and shortness of breath.   Cardiovascular:  Negative for chest pain and leg swelling.  Gastrointestinal:  Positive for abdominal pain, diarrhea, nausea and vomiting.  Genitourinary:  Negative for dysuria.  Musculoskeletal:  Positive for myalgias.  Neurological:  Negative for headaches.   Physical Exam Updated Vital Signs BP (!) 149/84 (BP Location: Left Arm)   Pulse (!) 105   Temp 100 F (37.8 C) (Oral)   Resp (!) 21   Ht 5\' 7"  (1.702 m)   Wt 124.7 kg   SpO2 99%   BMI 43.07 kg/m   Physical Exam Vitals and nursing note reviewed. Exam conducted with a chaperone present.  Constitutional:      Appearance: Normal appearance. He is obese.  HENT:     Head: Normocephalic and atraumatic.  Eyes:     General: No scleral icterus.       Right eye: No discharge.        Left eye: No discharge.     Extraocular Movements: Extraocular movements intact.     Pupils: Pupils are equal, round, and reactive to light.  Cardiovascular:     Rate and Rhythm: Regular rhythm. Tachycardia present.     Pulses: Normal pulses.     Heart sounds: Normal heart sounds. No murmur heard.   No friction rub. No gallop.  Pulmonary:     Effort: Pulmonary effort is normal. No respiratory distress.     Breath sounds: Normal breath sounds.     Comments: Lungs are clear to auscultation bilaterally, patient speaking complete sentences.  No accessory muscle usage, not tachypneic Abdominal:     General: Abdomen is flat and protuberant. Bowel sounds are normal. There is no distension.     Palpations: Abdomen is soft.     Tenderness: There is generalized abdominal tenderness. There is no guarding or rebound.     Comments: Abdomen is  protuberant, but not distended.  Abdomen is soft with generalized tenderness without guarding or rigidity.  Genitourinary:    Comments: Patient has hemorrhoids, but not actively bleeding.  No fissures, no fluctuance palpated.  Stool appears normal in coloration, not particularly bright red or melanotic. Skin:    General: Skin is warm and dry.     Coloration: Skin is not jaundiced.  Neurological:     Mental Status: He is alert. Mental status is at baseline.     Coordination: Coordination normal.   ED Results / Procedures / Treatments   Labs (all labs ordered are listed, but only abnormal results are displayed) Labs Reviewed  CBC WITH DIFFERENTIAL/PLATELET -  Abnormal; Notable for the following components:      Result Value   Platelets 145 (*)    All other components within normal limits  RESP PANEL BY RT-PCR (FLU A&B, COVID) ARPGX2  COMPREHENSIVE METABOLIC PANEL  URINALYSIS, ROUTINE W REFLEX MICROSCOPIC  LIPASE, BLOOD    EKG None  Radiology No results found.  Procedures Procedures   Medications Ordered in ED Medications  ondansetron (ZOFRAN) injection 4 mg (4 mg Intravenous Given 03/28/21 0651)  sodium chloride 0.9 % bolus 1,000 mL (1,000 mLs Intravenous New Bag/Given 03/28/21 [id number])    ED Course  I have reviewed the triage vital signs and the nursing notes.  Pertinent labs & imaging results that were available during my care of the patient were reviewed by me and considered in my medical decision making (see chart for details).  Clinical Course as of 03/28/21 0911  Mon Mar 28, 2021  0706 Lipase, blood Negative lipase, this lowers suspicion for pancreatitis especially in the context of no pain radiating to the back. [HS]  0725 CBC with Differential(!) No leukocytosis or anemia.  Lower suspicion for acute blood loss, additionally no leukocytosis concerning for intra-abdominal pathology.  This could be an early presentation, but overall is reassuring. [HS]  0827 POC occult  blood, ED Fecal occult negative, this is overall reassuring.  Lower suspicion for GI bleed, could have been hemorrhoid radial bleeding he saw in the toilet.  It also could have been swallowed blood from the hematemesis episode.  Expect hematemesis was likely a Mallory-Weiss tear from multiple episodes of emesis. [HS]  0827 DG Chest 2 View No evidence of pneumonia or intra pulmonary pathology that would produce hematemesis. [HS]  [id number] Comprehensive metabolic panel(!) Patient is mildly hyponatremic and hypokalemic, this is consistent with multiple episodes of emesis.  AST and ALT are elevated, with AST greater than ALT.  This is consistent with his alcohol use.  Not high enough for concern of hepatitis.  No anion gap concerning for DKA [HS]  0828 Work-up thus far with lab work is reassuring, I do suspect this is likely COVID versus gastroenteritis picture.  However, given patient's significant alcohol abuse and tenderness on exam I will order a CT abdomen to assess for intra abdominal pathology. [HS]    Clinical Course User Index [HS] Theron AristaSage, Aurelia Gras, PA-C   MDM Rules/Calculators/A&P                           Patient is mildly tachycardic, not tachypneic on exam.  0 temperature is 100, I suspect his rectal temperature would be a solid fever.  His symptoms sound like a gastroenteritis, also concern for possible gastritis, pancreatitis, pneumonia, viral URI, GI bleed.  Will check fecal occult test and check some basic labs.  We will give the patient fluids, Protonix, and Zofran.  Patient blood work is consistent with diabetes.  Random glucose greater than 200, will start the patient on metformin and follow-up with outpatient.  Information will be provided on diet as well.  Will give patient GI referral for further work-up.  He is already taking Prilosec I have refilled his prescription.  We will have him take it 30 minutes before his first meal the day until he is able to follow-up with GI.  Advised  reducing amount of alcohol he consumes, patient is already working on that.  Please see ED course for interpretation of labs and imaging as they pertain to the differential diagnosis and  MDM.  Patient tested positive for COVID, I suspect this is likely the cause of his symptoms.  He also has new diagnosed diabetes which is probably contributing.  Patient started on appropriate meds and given his panic management as well as permission on eating a diet plan.  His vitals are stable, he is appropriate discharge at this time.  Final Clinical Impression(s) / ED Diagnoses Final diagnoses:  None    Rx / DC Orders ED Discharge Orders     None        Theron Arista, PA-C 03/28/21 0913    Franne Forts, DO 03/29/21 [phone]

## 2021-12-05 ENCOUNTER — Emergency Department (HOSPITAL_BASED_OUTPATIENT_CLINIC_OR_DEPARTMENT_OTHER)
Admission: EM | Admit: 2021-12-05 | Discharge: 2021-12-05 | Disposition: A | Discharge status: Home / Self Care | Payer: Managed Care, Other (non HMO) | Attending: Emergency Medicine | Admitting: Emergency Medicine

## 2021-12-05 ENCOUNTER — Encounter (HOSPITAL_BASED_OUTPATIENT_CLINIC_OR_DEPARTMENT_OTHER): Payer: Self-pay | Admitting: Obstetrics and Gynecology

## 2021-12-05 ENCOUNTER — Other Ambulatory Visit (HOSPITAL_BASED_OUTPATIENT_CLINIC_OR_DEPARTMENT_OTHER): Payer: Self-pay

## 2021-12-05 ENCOUNTER — Emergency Department (HOSPITAL_BASED_OUTPATIENT_CLINIC_OR_DEPARTMENT_OTHER): Payer: Managed Care, Other (non HMO) | Admitting: Radiology

## 2021-12-05 ENCOUNTER — Other Ambulatory Visit: Payer: Self-pay

## 2021-12-05 DIAGNOSIS — S76302A Unspecified injury of muscle, fascia and tendon of the posterior muscle group at thigh level, left thigh, initial encounter: Secondary | ICD-10-CM | POA: Insufficient documentation

## 2021-12-05 DIAGNOSIS — W1840XA Slipping, tripping and stumbling without falling, unspecified, initial encounter: Secondary | ICD-10-CM | POA: Insufficient documentation

## 2021-12-05 DIAGNOSIS — S79922A Unspecified injury of left thigh, initial encounter: Secondary | ICD-10-CM | POA: Diagnosis present

## 2021-12-05 DIAGNOSIS — S76312A Strain of muscle, fascia and tendon of the posterior muscle group at thigh level, left thigh, initial encounter: Secondary | ICD-10-CM

## 2021-12-05 MED ORDER — MELOXICAM 15 MG PO TABS
15.0000 mg | ORAL_TABLET | Freq: Every day | ORAL | 0 refills | Status: AC
  Filled 2021-12-05: qty 15, 15d supply, fill #0

## 2021-12-05 NOTE — ED Provider Notes (Signed)
?[facility] EMERGENCY DEPT ?Provider Note ? ? ?CSN: [id number] ?Arrival date & time: 12/05/21  1343 ? ?  ? ?History ? ?Chief Complaint  ?Patient presents with  ? Back Pain  ? ? ?Mark Santos is a 36 y.o. male.  Patient complains of pain in the left buttock and left posterior thigh secondary to a near fall approximately 1 week ago.  Patient states he was running chasing his daughter when he slipped.  He denies falling but states that he felt a pull in his buttock and in his left leg.  The patient states that the pain has continued over the past week.  He has been taking Advil at home with little relief.  The patient denies any neurologic symptoms.  He denies any radicular symptoms. ? ?HPI ? ?  ? ?Home Medications ?Prior to Admission medications   ?Medication Sig Start Date End Date Taking? Authorizing Provider  ?meloxicam (MOBIC) 15 MG tablet Take 1 tablet (15 mg total) by mouth daily for 15 days. 12/05/21 12/20/21 Yes Darrick Grinder, PA-C  ?benzonatate (TESSALON) 100 MG capsule Take 1 capsule (100 mg total) by mouth every 8 (eight) hours. 03/28/21   Theron Arista, PA-C  ?bismuth subsalicylate (PEPTO BISMOL) 262 MG/15ML suspension Take 30 mLs by mouth every 6 (six) hours as needed for indigestion.    [provider]  ?cyclobenzaprine (FLEXERIL) 10 MG tablet Take 1 tablet (10 mg total) by mouth 2 (two) times daily as needed for muscle spasms. 01/06/21   Carroll Sage, PA-C  ?dicyclomine (BENTYL) 20 MG tablet Take 1 tablet (20 mg total) by mouth 2 (two) times daily. 04/25/19   Renne Crigler, PA-C  ?famotidine-calcium carbonate-magnesium hydroxide (PEPCID COMPLETE) 10-800-165 MG chewable tablet Chew 1 tablet by mouth daily as needed (nausea, vomiting).    [provider]  ?ibuprofen (ADVIL) 200 MG tablet Take 200 mg by mouth every 6 (six) hours as needed for moderate pain.    [provider]  ?ibuprofen (ADVIL) 600 MG tablet Take 1 tablet (600 mg total) by mouth every 6  (six) hours as needed. 01/25/20   Lorre Nick, MD  ?metFORMIN (GLUCOPHAGE) 500 MG tablet Take 1 tablet (500 mg total) by mouth 2 (two) times daily with a meal. 03/28/21   Theron Arista, PA-C  ?methocarbamol (ROBAXIN-750) 750 MG tablet Take 1 tablet (750 mg total) by mouth 4 (four) times daily. 01/25/20   Lorre Nick, MD  ?omeprazole (PRILOSEC) 20 MG capsule Take 1 capsule (20 mg total) by mouth daily. 03/28/21   Theron Arista, PA-C  ?ondansetron (ZOFRAN ODT) 4 MG disintegrating tablet Take 1 tablet (4 mg total) by mouth every 8 (eight) hours as needed for nausea or vomiting. 03/28/21   Theron Arista, PA-C  ?sucralfate (CARAFATE) 1 g tablet Take 1 tablet (1 g total) by mouth 4 (four) times daily -  with meals and at bedtime. 04/25/19   Renne Crigler, PA-C  ?albuterol (PROVENTIL HFA;VENTOLIN HFA) 108 (90 BASE) MCG/ACT inhaler Inhale 1-2 puffs into the lungs every 6 (six) hours as needed for wheezing or shortness of breath. ?Patient not taking: Reported on 04/24/2019 09/24/14 04/25/19  Lorre Nick, MD  ?fluticasone [facility]) 50 MCG/ACT nasal spray Place 2 sprays into both nostrils daily. ?Patient not taking: Reported on 04/24/2019 02/22/17 04/25/19  Everlene Farrier, PA-C  ?   ? ?Allergies    ?Patient has no known allergies.   ? ?Review of Systems   ?Review of Systems  ?Musculoskeletal:  Positive for myalgias. Negative for back  pain, gait problem and joint swelling.  ? ?Physical Exam ?Updated Vital Signs ?BP 123/85 (BP Location: Right Arm)   Pulse 84   Temp 98.5 ?F (36.9 ?C)   Resp 14   Ht 5\' 6"  (1.676 m)   Wt 127 kg   SpO2 98%   BMI 45.19 kg/m?  ?Physical Exam ?Vitals and nursing note reviewed.  ?Constitutional:   ?   General: He is not in acute distress. ?   Appearance: He is obese.  ?HENT:  ?   Head: Normocephalic.  ?Eyes:  ?   Conjunctiva/sclera: Conjunctivae normal.  ?Cardiovascular:  ?   Rate and Rhythm: Normal rate and regular rhythm.  ?   Pulses: Normal pulses.  ?Pulmonary:  ?   Effort: Pulmonary effort is normal.  ?    Breath sounds: Normal breath sounds.  ?Musculoskeletal:     ?   General: No swelling or tenderness.  ?   Cervical back: Normal range of motion.  ?   Comments: Pain with flexion and extension of the left hip felt in the left buttock and left posterior thigh, consistent with hamstrings  ?Neurological:  ?   Mental Status: He is alert and oriented to person, place, and time.  ? ? ?ED Results / Procedures / Treatments   ?Labs ?(all labs ordered are listed, but only abnormal results are displayed) ?Labs Reviewed - No data to display ? ?EKG ?None ? ?Radiology ?DG HIP UNILAT WITH PELVIS 2-3 VIEWS LEFT ? ?Result Date: 12/05/2021 ?CLINICAL DATA:  Left hip pain since fall a week ago. EXAM: DG HIP (WITH OR WITHOUT PELVIS) 2-3V LEFT COMPARISON:  CT abdomen pelvis dated March 28, 2021. FINDINGS: There is no evidence of hip fracture or dislocation. There is no evidence of arthropathy or other focal bone abnormality. IMPRESSION: Negative. Electronically Signed   By: March 30, 2021 M.D.   On: 12/05/2021 14:38   ? ?Procedures ?Procedures  ? ? ?Medications Ordered in ED ?Medications - No data to display ? ?ED Course/ Medical Decision Making/ A&P ?  ?                        ?Medical Decision Making ?Amount and/or Complexity of Data Reviewed ?Radiology: ordered. ? ? ?Patient presents to the hospital, complaining of left anterior leg pain.  Differential includes fracture, dislocation, soft tissue injury, and others ? ?The patient has no back pain and no radicular symptoms.  He did not fall during the injury.  I ordered and interpreted imaging including plain x-rays of the left hip which showed no fracture or bone abnormality.  I agree with the radiologist findings.  Fracture and dislocation very low clinical suspicion. ? ?Based on the patient's symptoms and location, hamstring injury seems likely.  The patient is able to move so I do not feel that he has a full avulsion of the hamstring tendons.  He does not have severe bruising.   This is likely a muscle strain. ? ?The patient may discharge home.  I will prescribe meloxicam to be taken once a day in place of the Advil.  He may use an Ace wrap as desired for compression and may ice the affected area multiple times per day.  I will provide orthopedic follow-up information so that the patient may seek specialty care as needed ? ?Final Clinical Impression(s) / ED Diagnoses ?Final diagnoses:  ?Hamstring strain, left, initial encounter  ? ? ?Rx / DC Orders ?ED Discharge Orders   ? ?  Ordered  ?  meloxicam (MOBIC) 15 MG tablet  Daily       ? 12/05/21 1558  ? ?  ?  ? ?  ? ? ?  ?Darrick GrinderMcCauley, Gyan Cambre B, PA-C ?12/05/21 1615 ? ?  ?Ernie AvenaLawsing, James, MD ?12/06/21 [id number] ? ?

## 2021-12-05 NOTE — Discharge Instructions (Addendum)
You were diagnosed today with a hamstring strain.  Recommend meloxicam once daily.  Do not take any other NSAID medication while taking this medication.  You may take Tylenol as needed.  Consider wrapping to the thigh with an Ace bandage for compression.  You may also ice the area as desired multiple times per day.  I have provided an orthopedic follow-up provider for you to contact as needed. ?

## 2021-12-05 NOTE — ED Notes (Signed)
RN provided AVS using Teachback Method. Patient verbalizes understanding of Discharge Instructions. Opportunity for Questioning and Answers were provided by RN. Patient Discharged from ED ambulatory to Home via Self.  

## 2021-12-05 NOTE — ED Triage Notes (Signed)
Patient reports to the ER for a slip x1 week ago and reports he thinks he pulled something because he has lower back/tailbone pain that is going into the left knee and he cannot bend over. Patient denies complete fall.  ?

## 2021-12-12 ENCOUNTER — Other Ambulatory Visit (HOSPITAL_BASED_OUTPATIENT_CLINIC_OR_DEPARTMENT_OTHER): Payer: Self-pay

## 2022-03-26 ENCOUNTER — Emergency Department (HOSPITAL_BASED_OUTPATIENT_CLINIC_OR_DEPARTMENT_OTHER)
Admission: EM | Admit: 2022-03-26 | Discharge: 2022-03-27 | Disposition: A | Discharge status: Home / Self Care | Payer: Self-pay | Attending: Emergency Medicine | Admitting: Emergency Medicine

## 2022-03-26 ENCOUNTER — Other Ambulatory Visit: Payer: Self-pay

## 2022-03-26 ENCOUNTER — Encounter (HOSPITAL_BASED_OUTPATIENT_CLINIC_OR_DEPARTMENT_OTHER): Payer: Self-pay

## 2022-03-26 ENCOUNTER — Emergency Department (HOSPITAL_BASED_OUTPATIENT_CLINIC_OR_DEPARTMENT_OTHER): Payer: Self-pay

## 2022-03-26 DIAGNOSIS — F10930 Alcohol use, unspecified with withdrawal, uncomplicated: Secondary | ICD-10-CM | POA: Insufficient documentation

## 2022-03-26 DIAGNOSIS — R109 Unspecified abdominal pain: Secondary | ICD-10-CM | POA: Insufficient documentation

## 2022-03-26 DIAGNOSIS — R0789 Other chest pain: Secondary | ICD-10-CM

## 2022-03-26 DIAGNOSIS — R112 Nausea with vomiting, unspecified: Secondary | ICD-10-CM

## 2022-03-26 DIAGNOSIS — R7989 Other specified abnormal findings of blood chemistry: Secondary | ICD-10-CM | POA: Insufficient documentation

## 2022-03-26 LAB — BASIC METABOLIC PANEL
Anion gap: 12 (ref 5–15)
BUN: <5 mg/dL — ABNORMAL LOW (ref 6–20)
CO2: 22 mmol/L (ref 22–32)
Calcium: 9.3 mg/dL (ref 8.9–10.3)
Chloride: 100 mmol/L (ref 98–111)
Creatinine, Ser: 0.75 mg/dL (ref 0.61–1.24)
GFR, Estimated: >60 mL/min (ref >60)
Glucose, Bld: 329 mg/dL — ABNORMAL HIGH (ref 70–99)
Potassium: 3.7 mmol/L (ref 3.5–5.1)
Sodium: 134 mmol/L — ABNORMAL LOW (ref 135–145)

## 2022-03-26 LAB — CBC
HCT: 45.1 % (ref 39.0–52.0)
Hemoglobin: 15.4 g/dL (ref 13.0–17.0)
MCH: 30.4 pg (ref 26.0–34.0)
MCHC: 34.1 g/dL (ref 30.0–36.0)
MCV: 89 fL (ref 80.0–100.0)
Platelets: 133 10*3/uL — ABNORMAL LOW (ref 150–400)
RBC: 5.07 MIL/uL (ref 4.22–5.81)
RDW: 12.9 % (ref 11.5–15.5)
WBC: 7.3 10*3/uL (ref 4.0–10.5)
nRBC: 0 % (ref 0.0–0.2)

## 2022-03-26 LAB — TROPONIN I (HIGH SENSITIVITY): Troponin I (High Sensitivity): 9 ng/L (ref <18)

## 2022-03-26 LAB — CBG MONITORING, ED: Glucose-Capillary: 314 mg/dL — ABNORMAL HIGH (ref 70–99)

## 2022-03-26 NOTE — ED Triage Notes (Signed)
Patient here POV from Home.  Endorses Mid CP that began yesterday. Intermittent initially and worsened today and has been more constant.  SOB, N/V.   Shaking during Triage. A&Ox4. Gcs 15. Ambulatory.

## 2022-03-27 ENCOUNTER — Emergency Department (HOSPITAL_BASED_OUTPATIENT_CLINIC_OR_DEPARTMENT_OTHER): Payer: Self-pay

## 2022-03-27 LAB — HEPATIC FUNCTION PANEL
ALT: 33 U/L (ref 0–44)
AST: 67 U/L — ABNORMAL HIGH (ref 15–41)
Albumin: 3.9 g/dL (ref 3.5–5.0)
Alkaline Phosphatase: 105 U/L (ref 38–126)
Bilirubin, Direct: 0.5 mg/dL — ABNORMAL HIGH (ref 0.0–0.2)
Indirect Bilirubin: 1.8 mg/dL — ABNORMAL HIGH (ref 0.3–0.9)
Total Bilirubin: 2.3 mg/dL — ABNORMAL HIGH (ref 0.3–1.2)
Total Protein: 8.4 g/dL — ABNORMAL HIGH (ref 6.5–8.1)

## 2022-03-27 LAB — TROPONIN I (HIGH SENSITIVITY): Troponin I (High Sensitivity): 9 ng/L (ref <18)

## 2022-03-27 LAB — LIPASE, BLOOD: Lipase: 13 U/L (ref 11–51)

## 2022-03-27 MED ORDER — CHLORDIAZEPOXIDE HCL 25 MG PO CAPS: ORAL_CAPSULE | ORAL | 0 refills | Status: DC

## 2022-03-27 MED ORDER — ONDANSETRON 4 MG PO TBDP: 4.0000 mg | ORAL_TABLET | Freq: Three times a day (TID) | ORAL | 0 refills | Status: DC | PRN

## 2022-03-27 MED ORDER — LORAZEPAM 2 MG/ML IJ SOLN
0.5000 mg | Freq: Once | INTRAMUSCULAR | Status: AC
  Administered 2022-03-27: 0.5 mg via INTRAVENOUS
  Filled 2022-03-27: qty 1

## 2022-03-27 MED ORDER — ONDANSETRON HCL 4 MG/2ML IJ SOLN
4.0000 mg | Freq: Once | INTRAMUSCULAR | Status: AC
  Administered 2022-03-27: 4 mg via INTRAVENOUS
  Filled 2022-03-27: qty 2

## 2022-03-27 MED ORDER — SODIUM CHLORIDE 0.9 % IV BOLUS
1000.0000 mL | Freq: Once | INTRAVENOUS | Status: AC
  Administered 2022-03-27: 1000 mL via INTRAVENOUS

## 2022-03-27 MED ORDER — IOHEXOL 300 MG/ML  SOLN
100.0000 mL | Freq: Once | INTRAMUSCULAR | Status: AC | PRN
Start: 2022-03-27 — End: 2022-03-27
  Administered 2022-03-27: 100 mL via INTRAVENOUS

## 2022-03-27 MED ORDER — PANTOPRAZOLE SODIUM 40 MG IV SOLR
40.0000 mg | Freq: Once | INTRAVENOUS | Status: AC
  Administered 2022-03-27: 40 mg via INTRAVENOUS
  Filled 2022-03-27: qty 10

## 2022-03-27 NOTE — ED Provider Notes (Signed)
[facility] EMERGENCY DEPT Provider Note   CSN: [id number] Arrival date & time: 03/26/22  2222     History  Chief Complaint  Patient presents with   Chest Pain    Mark Santos is a 36 y.o. male.  HPI     This is a 36 year old male who presents with chest pain.  Patient reports he has had 2-day history of intermittent worsening chest discomfort.  At times he describes it as sharp.  He has had some nausea and vomiting.  He states he has had decreased p.o. intake.  He has also had some diarrhea.  No known sick contacts.  Patient states that he has generally been felt very jittery.  He used to drink a 12 pack of beer daily.  He recently decrease this to 6 pack/day but has not had anything to drink in over 24 hours.  Home Medications Prior to Admission medications   Medication Sig Start Date End Date Taking? Authorizing Provider  chlordiazePOXIDE (LIBRIUM) 25 MG capsule 50mg  PO TID x 1D, then 25-50mg  PO BID X 1D, then 25-50mg  PO QD X 1D 03/27/22  Yes Nilaya Bouie, 05/27/22, MD  ondansetron (ZOFRAN-ODT) 4 MG disintegrating tablet Take 1 tablet (4 mg total) by mouth every 8 (eight) hours as needed for nausea or vomiting. 03/27/22  Yes Magdaleno Lortie, 05/27/22, MD  benzonatate (TESSALON) 100 MG capsule Take 1 capsule (100 mg total) by mouth every 8 (eight) hours. 03/28/21   05/28/21, PA-C  bismuth subsalicylate (PEPTO BISMOL) 262 MG/15ML suspension Take 30 mLs by mouth every 6 (six) hours as needed for indigestion.    [provider]  cyclobenzaprine (FLEXERIL) 10 MG tablet Take 1 tablet (10 mg total) by mouth 2 (two) times daily as needed for muscle spasms. 01/06/21   01/08/21, PA-C  dicyclomine (BENTYL) 20 MG tablet Take 1 tablet (20 mg total) by mouth 2 (two) times daily. 04/25/19   06/25/19, PA-C  famotidine-calcium carbonate-magnesium hydroxide (PEPCID COMPLETE) 10-800-165 MG chewable tablet Chew 1 tablet by mouth daily as needed (nausea, vomiting).     [provider]  ibuprofen (ADVIL) 200 MG tablet Take 200 mg by mouth every 6 (six) hours as needed for moderate pain.    [provider]  ibuprofen (ADVIL) 600 MG tablet Take 1 tablet (600 mg total) by mouth every 6 (six) hours as needed. 01/25/20   03/27/20, MD  metFORMIN (GLUCOPHAGE) 500 MG tablet Take 1 tablet (500 mg total) by mouth 2 (two) times daily with a meal. 03/28/21   05/28/21, PA-C  methocarbamol (ROBAXIN-750) 750 MG tablet Take 1 tablet (750 mg total) by mouth 4 (four) times daily. 01/25/20   03/27/20, MD  omeprazole (PRILOSEC) 20 MG capsule Take 1 capsule (20 mg total) by mouth daily. 03/28/21   05/28/21, PA-C  ondansetron (ZOFRAN ODT) 4 MG disintegrating tablet Take 1 tablet (4 mg total) by mouth every 8 (eight) hours as needed for nausea or vomiting. 03/28/21   05/28/21, PA-C  sucralfate (CARAFATE) 1 g tablet Take 1 tablet (1 g total) by mouth 4 (four) times daily -  with meals and at bedtime. 04/25/19   06/25/19, PA-C  albuterol (PROVENTIL HFA;VENTOLIN HFA) 108 (90 BASE) MCG/ACT inhaler Inhale 1-2 puffs into the lungs every 6 (six) hours as needed for wheezing or shortness of breath. Patient not taking: Reported on 04/24/2019 09/24/14 04/25/19  06/25/19, MD  fluticasone [facility]) 50 MCG/ACT nasal spray Place 2 sprays into  both nostrils daily. Patient not taking: Reported on 04/24/2019 02/22/17 04/25/19  Everlene Farrier, PA-C      Allergies    Patient has no known allergies.    Review of Systems   Review of Systems  Constitutional:  Negative for fever.  Cardiovascular:  Positive for chest pain.  Gastrointestinal:  Positive for diarrhea, nausea and vomiting. Negative for abdominal pain.  Neurological:  Positive for tremors.  All other systems reviewed and are negative.   Physical Exam Updated Vital Signs BP (!) 139/91   Pulse 75   Temp 98.3 F (36.8 C)   Resp (!) 21   Ht 1.676 m (5\' 6" )   Wt 127 kg   SpO2 97%   BMI 45.19 kg/m   Physical Exam Vitals and nursing note reviewed.  Constitutional:      Appearance: He is well-developed. He is obese. He is not ill-appearing.  HENT:     Head: Normocephalic and atraumatic.  Eyes:     Pupils: Pupils are equal, round, and reactive to light.  Cardiovascular:     Rate and Rhythm: Normal rate and regular rhythm.     Heart sounds: Normal heart sounds. No murmur heard. Pulmonary:     Effort: Pulmonary effort is normal. No respiratory distress.     Breath sounds: Normal breath sounds. No wheezing.  Abdominal:     General: Bowel sounds are normal.     Palpations: Abdomen is soft.     Tenderness: There is no abdominal tenderness. There is no rebound.  Musculoskeletal:     Cervical back: Neck supple.  Lymphadenopathy:     Cervical: No cervical adenopathy.  Skin:    General: Skin is warm and dry.  Neurological:     Mental Status: He is alert and oriented to person, place, and time.     Comments: Resting tremor noted  Psychiatric:        Mood and Affect: Mood is anxious.     ED Results / Procedures / Treatments   Labs (all labs ordered are listed, but only abnormal results are displayed) Labs Reviewed  BASIC METABOLIC PANEL - Abnormal; Notable for the following components:      Result Value   Sodium 134 (*)    Glucose, Bld 329 (*)    BUN <5 (*)    All other components within normal limits  CBC - Abnormal; Notable for the following components:   Platelets 133 (*)    All other components within normal limits  HEPATIC FUNCTION PANEL - Abnormal; Notable for the following components:   Total Protein 8.4 (*)    AST 67 (*)    Total Bilirubin 2.3 (*)    Bilirubin, Direct 0.5 (*)    Indirect Bilirubin 1.8 (*)    All other components within normal limits  CBG MONITORING, ED - Abnormal; Notable for the following components:   Glucose-Capillary 314 (*)    All other components within normal limits  LIPASE, BLOOD  TROPONIN I (HIGH SENSITIVITY)  TROPONIN I (HIGH  SENSITIVITY)    EKG EKG Interpretation  Date/Time:  Sunday March 26 2022 22:28:22 EDT Ventricular Rate:  96 PR Interval:  150 QRS Duration: 84 QT Interval:  360 QTC Calculation: 454 R Axis:   27 Text Interpretation: Normal sinus rhythm Normal ECG When compared with ECG of 19-Oct-2013 03:33, PREVIOUS ECG IS PRESENT Confirmed by 21-Oct-2013 (Ross Marcus) on 03/27/2022 12:00:20 AM  Radiology CT ABDOMEN PELVIS W CONTRAST  Result Date: 03/27/2022 CLINICAL DATA:  Nausea  vomiting. EXAM: CT ABDOMEN AND PELVIS WITH CONTRAST TECHNIQUE: Multidetector CT imaging of the abdomen and pelvis was performed using the standard protocol following bolus administration of intravenous contrast. RADIATION DOSE REDUCTION: This exam was performed according to the departmental dose-optimization program which includes automated exposure control, adjustment of the mA and/or kV according to patient size and/or use of iterative reconstruction technique. CONTRAST:  OMNIPAQUE IOHEXOL 300 MG/ML  SOLN COMPARISON:  None Available. FINDINGS: Lower chest: The visualized lung bases are clear. No intra-abdominal free air or free fluid. Hepatobiliary: Fatty liver. No biliary dilatation. The gallbladder is unremarkable. Pancreas: Unremarkable. No pancreatic ductal dilatation or surrounding inflammatory changes. Spleen: Normal in size without focal abnormality. Adrenals/Urinary Tract: The adrenal glands unremarkable. The kidneys, visualized ureters, and the latter appear unremarkable. Stomach/Bowel: There is no bowel obstruction or active inflammation. The appendix is normal. Vascular/Lymphatic: The abdominal aorta and IVC unremarkable. No portal venous gas. There is no adenopathy. Reproductive: The prostate and seminal vesicles are grossly unremarkable. No pelvic mass. Other: None Musculoskeletal: No acute or significant osseous findings. IMPRESSION: 1. No acute intra-abdominal or pelvic pathology. 2. Fatty liver. Electronically  Signed   By: Elgie Collard M.D.   On: 03/27/2022 02:45   DG Chest Port 1 View  Result Date: 03/26/2022 CLINICAL DATA:  Chest pain. EXAM: PORTABLE CHEST 1 VIEW COMPARISON:  Chest radiograph dated 03/28/2021. FINDINGS: The heart size and mediastinal contours are within normal limits. Both lungs are clear. The visualized skeletal structures are unremarkable. IMPRESSION: No active disease. Electronically Signed   By: Elgie Collard M.D.   On: 03/26/2022 23:53    Procedures Procedures    Medications Ordered in ED Medications  sodium chloride 0.9 % bolus 1,000 mL (1,000 mLs Intravenous New Bag/Given 03/27/22 0035)  ondansetron (ZOFRAN) injection 4 mg (4 mg Intravenous Given 03/27/22 0035)  LORazepam (ATIVAN) injection 0.5 mg (0.5 mg Intravenous Given 03/27/22 0035)  pantoprazole (PROTONIX) injection 40 mg (40 mg Intravenous Given 03/27/22 0036)  iohexol (OMNIPAQUE) 300 MG/ML solution 100 mL (100 mLs Intravenous Contrast Given 03/27/22 0220)    ED Course/ Medical Decision Making/ A&P Clinical Course as of 03/27/22 8546  [facility] Mar 27, 2022  0309 Patient states he feels much better.  Discussed with him that it is likely multifactorial.  He could have a virus but also likely some withdrawal symptoms.  Will send home with Librium taper as patient is interested in continuing to cut back drinking. [CH]    Clinical Course User Index [CH] Darrielle Pflieger, Mayer Masker, MD                           Medical Decision Making Amount and/or Complexity of Data Reviewed Labs: ordered. Radiology: ordered.  Risk Prescription drug management.   This patient presents to the ED for concern of chest pain, nausea, vomiting, this involves an extensive number of treatment options, and is a complaint that carries with it a high risk of complications and morbidity.  I considered the following differential and admission for this acute, potentially life threatening condition.  The differential diagnosis includes atypical ACS,  gastritis, gastroenteritis, pancreatitis, cholecystitis, alcohol withdrawal  MDM:    This is a 36 year old male who presents with chest pain, nausea, vomiting.  Nontoxic.  Vital signs are reassuring.  Chest pain is very atypical.  It is associated with nausea and vomiting.  May be GI related.  Very uncharacteristic of ACS.  EKG without acute ischemic arrhythmic changes.  Troponin x2 negative.  Labs obtained.  Lipase normal.  Slight elevation in his LFTs mostly his bilirubin.  No significant leukocytosis.  I highly suspect that some of his acute symptoms may be related to decreased alcohol intake given his history.  He was given a dose of Ativan, fluids, nausea medication.  CT scan of the abdomen does not show any acute abnormalities, specifically no gallbladder or liver pathology.  On recheck, patient states he feels much better.  He is interested in stopping drinking.  We discussed a Librium taper.  Additionally we will send him with Zofran.  (Labs, imaging, consults)  Labs: I Ordered, and personally interpreted labs.  The pertinent results include: CBC, CMP, lipase, troponin  Imaging Studies ordered: I ordered imaging studies including chest x-ray, CT scan of the abdomen I independently visualized and interpreted imaging. I agree with the radiologist interpretation  Additional history obtained from chart review.  External records from outside source obtained and reviewed including evaluations  Cardiac Monitoring: The patient was maintained on a cardiac monitor.  I personally viewed and interpreted the cardiac monitored which showed an underlying rhythm of: Sinus rhythm  Reevaluation: After the interventions noted above, I reevaluated the patient and found that they have :improved  Social Determinants of Health: Lives independently, alcohol abuse  Disposition: Discharge  Co morbidities that complicate the patient evaluation  Past Medical History:  Diagnosis Date   Pinched nerve     Pinched nerve in back     Medicines Meds ordered this encounter  Medications   sodium chloride 0.9 % bolus 1,000 mL   ondansetron (ZOFRAN) injection 4 mg   LORazepam (ATIVAN) injection 0.5 mg   pantoprazole (PROTONIX) injection 40 mg   iohexol (OMNIPAQUE) 300 MG/ML solution 100 mL   chlordiazePOXIDE (LIBRIUM) 25 MG capsule    Sig: 50mg  PO TID x 1D, then 25-50mg  PO BID X 1D, then 25-50mg  PO QD X 1D    Dispense:  10 capsule    Refill:  0   ondansetron (ZOFRAN-ODT) 4 MG disintegrating tablet    Sig: Take 1 tablet (4 mg total) by mouth every 8 (eight) hours as needed for nausea or vomiting.    Dispense:  20 tablet    Refill:  0    I have reviewed the patients home medicines and have made adjustments as needed  Problem List / ED Course: Problem List Items Addressed This Visit   None Visit Diagnoses     Atypical chest pain    -  Primary   Nausea and vomiting, unspecified vomiting type       Elevated LFTs       Alcohol withdrawal syndrome without complication (HCC)                       Final Clinical Impression(s) / ED Diagnoses Final diagnoses:  Atypical chest pain  Nausea and vomiting, unspecified vomiting type  Elevated LFTs  Alcohol withdrawal syndrome without complication (HCC)    Rx / DC Orders ED Discharge Orders          Ordered    chlordiazePOXIDE (LIBRIUM) 25 MG capsule        03/27/22 0311    ondansetron (ZOFRAN-ODT) 4 MG disintegrating tablet  Every 8 hours PRN        03/27/22 0311              05/27/22, MD 03/27/22 [phone]

## 2022-03-27 NOTE — Discharge Instructions (Signed)
You were seen today for chest discomfort, nausea, vomiting.  You may have some element of alcohol withdrawal.  You should not stop drinking cold [place] but should do it with the assistance of medications and under doctor supervision.  You need to establish primary care.  Your labs today were notable for elevated liver enzymes.  You need to have these rechecked.

## 2022-03-27 NOTE — ED Notes (Signed)
Pt verbalizes understanding of discharge instructions. Opportunity for questioning and answers were provided. Pt discharged from ED to home in cab.    

## 2022-03-28 ENCOUNTER — Telehealth: Payer: Self-pay

## 2022-03-28 NOTE — Telephone Encounter (Signed)
I can't tell if he has any insurance (nothing in the computer, only has had ER visits), which factors in this decision, as I thought our office wasn't taking uninsured (there are better resources available for them)

## 2022-03-28 NOTE — Telephone Encounter (Signed)
What he really needs is to be able to get in with behavioral health--to treat his anxiety/depression (and alcohol issues, per the ER note). It would be great if someone could provide him resources for uninsured and behavioral health.   I'm cc'ing this to Lafonda Mosses for that reason.

## 2022-03-28 NOTE — Telephone Encounter (Signed)
Pt called and states that he does not have insurance at this time and I informed him that we are not taking new pt w/o and that is out office policy but he still wanted me to ask since his wife and mother n law are patients, I told him I would

## 2022-03-28 NOTE — Telephone Encounter (Signed)
Pt. Called stating his wife is a pt. Of yours Mark Santos and he wanted to know if he could be a new pt. Of yours or could see someone else if possible. He went to the ED because of severe depression and anxiety. He stated it is making it really hard for him to do anything right now because his anxiety is so bad. He stated the ED wanted him to f/u with a PCP and he des not currently have one.

## 2022-03-29 NOTE — Telephone Encounter (Signed)
I have reached out to the director over behavioral health for direction, awaiting response.

## 2022-03-30 NOTE — Telephone Encounter (Signed)
I was advised the [facility], which is [employer]  Pt has to be in [facility]. They see Medicaid and indigent patients.  BH has open access hours for therapy and medication for walk ins Mon - Fri 8 - 11.  Should arrive by 7:15 to get a spot  [phone]  Also pcp with no insurance  [facility] [phone] or [facility] [phone]  Called pt reached voice mail - lmtrc.

## 2022-03-31 ENCOUNTER — Telehealth: Payer: Self-pay | Admitting: General Practice

## 2022-03-31 NOTE — Telephone Encounter (Signed)
Pt called back to speak to Mark Santos. Lafonda Mosses not in so message in chart was relayed.

## 2022-04-10 ENCOUNTER — Encounter (HOSPITAL_BASED_OUTPATIENT_CLINIC_OR_DEPARTMENT_OTHER): Payer: Self-pay

## 2022-04-10 ENCOUNTER — Ambulatory Visit (INDEPENDENT_AMBULATORY_CARE_PROVIDER_SITE_OTHER): Payer: Self-pay | Admitting: Family Medicine

## 2022-04-10 ENCOUNTER — Encounter: Payer: Self-pay | Admitting: Family Medicine

## 2022-04-10 VITALS — BP 134/86 | HR 87 | Temp 97.6°F | Ht 67.5 in | Wt 264.0 lb

## 2022-04-10 DIAGNOSIS — F1021 Alcohol dependence, in remission: Secondary | ICD-10-CM

## 2022-04-10 DIAGNOSIS — Z599 Problem related to housing and economic circumstances, unspecified: Secondary | ICD-10-CM | POA: Insufficient documentation

## 2022-04-10 DIAGNOSIS — F1020 Alcohol dependence, uncomplicated: Secondary | ICD-10-CM | POA: Insufficient documentation

## 2022-04-10 DIAGNOSIS — F10982 Alcohol use, unspecified with alcohol-induced sleep disorder: Secondary | ICD-10-CM | POA: Insufficient documentation

## 2022-04-10 DIAGNOSIS — F1210 Cannabis abuse, uncomplicated: Secondary | ICD-10-CM

## 2022-04-10 DIAGNOSIS — F322 Major depressive disorder, single episode, severe without psychotic features: Secondary | ICD-10-CM

## 2022-04-10 DIAGNOSIS — F419 Anxiety disorder, unspecified: Secondary | ICD-10-CM

## 2022-04-10 DIAGNOSIS — R45851 Suicidal ideations: Secondary | ICD-10-CM

## 2022-04-10 DIAGNOSIS — E1165 Type 2 diabetes mellitus with hyperglycemia: Secondary | ICD-10-CM

## 2022-04-10 HISTORY — DX: Suicidal ideations: R45.851

## 2022-04-10 MED ORDER — METFORMIN HCL 500 MG PO TABS: 500.0000 mg | ORAL_TABLET | Freq: Two times a day (BID) | ORAL | 3 refills | Status: DC

## 2022-04-10 MED ORDER — GABAPENTIN 300 MG PO CAPS: ORAL_CAPSULE | ORAL | 3 refills | Status: DC

## 2022-04-10 NOTE — Assessment & Plan Note (Signed)
Suicide resources provided

## 2022-04-10 NOTE — Assessment & Plan Note (Addendum)
Comorbid marijuana use Associated with insomnia, anxiety, depression Status post recent Librium taper, has reduced from 12 cans/day to 4 cans/day Without any signs of withdrawal at this time Discussed possible fatal side effects of immediate withdrawal Patient with significant findings difficult, says that he is without resources for rehabilitation Can trial gabapentin to reduce craving Referral to social work and addiction medicine Recommend patient follow-up with AA Follow-up 1 month

## 2022-04-10 NOTE — Assessment & Plan Note (Addendum)
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

## 2022-04-10 NOTE — Progress Notes (Signed)
Assessment/Plan:   Problem List Items Addressed This Visit       Endocrine   Type 2 diabetes mellitus with hyperglycemia, without long-term current use of insulin (HCC)    Recent random glucose in ED was 300 Patient restart metformin 500 mg twice daily Recommend getting additional restratification labs include hemoglobin A1c and lipid panel      Relevant Medications   metFORMIN (GLUCOPHAGE) 500 MG tablet     Other   Alcohol use disorder, severe, in early remission, dependence (HCC) - Primary    Comorbid marijuana use Associated with insomnia, anxiety, depression Status post recent Librium taper, has reduced from 12 cans/day to 4 cans/day Without any signs of withdrawal at this time Discussed possible fatal side effects of immediate withdrawal Patient with significant findings difficult, says that he is without resources for rehabilitation Can trial gabapentin to reduce craving Referral to social work and addiction medicine Recommend patient follow-up with AA Follow-up 1 month      Relevant Medications   gabapentin (NEURONTIN) 300 MG capsule   Other Relevant Orders   Ambulatory referral to Social Work   Ambulatory referral to Chemical Dependency   Passive suicidal ideations    Suicide resources provided      Relevant Orders   Ambulatory referral to Chemical Dependency   Current severe episode of major depressive disorder without psychotic features (HCC)    Associated with comorbid substance use disorder with alcohol and THC, and anxiety Passive SI Patient declined referral to therapy or psychiatric services Possibly may get better with reduction in alcohol consumption Recommend cessation of THC Psychiatric resources provided       Relevant Medications   gabapentin (NEURONTIN) 300 MG capsule   Other Relevant Orders   Ambulatory referral to Social Work   Ambulatory referral to Chemical Dependency   Severe anxiety    Associated with comorbid substance use  disorder including alcohol, THC and depression Likely worsening in setting of reduction in alcohol consumption Declined psychiatric and counseling referral Trial gabapentin for anti-anxiolytic effects       Relevant Medications   gabapentin (NEURONTIN) 300 MG capsule   Other Relevant Orders   Ambulatory referral to Social Work   Ambulatory referral to Chemical Dependency   Financial difficulties    Community resources provided Referral to social work for additional resources      Relevant Orders   Ambulatory referral to Social Work   Ambulatory referral to Chemical Dependency   Alcohol-induced insomnia (HCC)    Add gabapentin nightly to see if this improves insomnia      Relevant Medications   gabapentin (NEURONTIN) 300 MG capsule   Other Visit Diagnoses     Marijuana abuse              Subjective:  HPI:  Mark Santos is a 36 y.o. male who has Alcohol use disorder, severe, in early remission, dependence (HCC); Passive suicidal ideations; Current severe episode of major depressive disorder without psychotic features (HCC); Severe anxiety; Financial difficulties; Type 2 diabetes mellitus with hyperglycemia, without long-term current use of insulin (HCC); and Alcohol-induced insomnia (HCC) on their problem list..   He  has a past medical history of Diabetes mellitus without complication (HCC) and Pinched nerve.[name]   He presents with chief complaint of Establish Care (PHQ/GAD) .     Adopted  Cut back to 2-4 beers a day, doesn't feel much "craving" right now, uses it to help him sleep,   Considering AA,   Suicide  Assessment  Plan: - How specific is the plan: no plan - How lethal are the means: Has history of environment, but gave keys to gun safe to a friend - Does the patient have social support: wife and child of 48 years old   Protective factors (what has kept the patient from self-harm thus far): See of  Substance use / abuse: Significant history of  alcohol use, smokes marijuana daily, he has tried delta 8 gummies but didn't help   Presence of hallucinations / delusions: None  History of SI / Attempts: None  Family history of attempted or completed suicide: Unsure because he is adopted  Duration and Intensity of SI: Mild, past few months  History of prior psychiatric hospitalizations: None  Chart review for additional risk factors (cite chronic pain, insomnia, panic attacks, age, gender, if present): Insomnia and anxiety  Coping mechanisms: Try to reduce drinking, spending time with family  How likely are you to act on these thoughts of hurting yourself or ending your life sometime over the next month? NOT LIKELY AT ALL  If somewhat or very likely, consider hospitalization.  Consult suicide protocol if you have not yet done so.  Depression/Anxiety,  Patient reports longtime history of both, but recently worse due to the loss of a close friend Current Medications: Self medicated with alcohol and marijuana Side Effects:  Current Symptoms/Interim History: Reduce drinking, has seen rebound in anxiety and insomnia     04/10/2022   10:23 AM  Depression screen PHQ 2/9  Decreased Interest 3  Down, Depressed, Hopeless 3  PHQ - 2 Score 6  Altered sleeping 3  Tired, decreased energy 3  Change in appetite 3  Feeling bad or failure about yourself  3  Trouble concentrating 3  Moving slowly or fidgety/restless 3  Suicidal thoughts 1  PHQ-9 Score 25  Difficult doing work/chores Extremely dIfficult       04/10/2022   10:23 AM  GAD 7 : Generalized Anxiety Score  Nervous, Anxious, on Edge 3  Control/stop worrying 3  Worry too much - different things 3  Trouble relaxing 3  Restless 3  Easily annoyed or irritable 3  Afraid - awful might happen 3  Total GAD 7 Score 21  Anxiety Difficulty Extremely difficult    ROS: No SI or HI.  Alcohol abuse.  Patient has history of significant alcohol use of at least 12 cans of a day for  several years.  Patient had death of a close friend that "died in their sleep" a few years ago.  This incited patient to try to stop drinking.  He states that he was scared he would "fail his family".  Patient was seen in the ED approximately 2 weeks ago for ongoing chest discomfort.  At that time patient had been trying to reduce consumption to to 6 cans/day.  Patient was evaluated in ED for chest pain, it was without ST elevation and negative troponins.  Discharge was felt to be related to alcohol use.  Patient was prescribed a Librium taper, which successfully completed. He also reports significant anxiety and difficulty sleeping as he tried to cut back.  He does deny that he has significant alcohol cravings.  Patient denies any chest pain, shortness of breath seizure-like activity, tremors, or delusions.  Patient states that he is without insurance and cannot afford a inpatient rehab.  Patient does have a friend who has been sober for 8 years.  His friend regularly attends AA meetings.  Patient has  been contemplating this.  DIABETES Type II, established problem, unstable Medications: Has been prescribed metformin in the past, but says he does not like to take pills due to the feeling that they are "stuck in his throat"  Interim History-patient states that he helps with blood sugar will come down his reduce his alcohol consumption.  BMP approxi-2 weeks ago had blood sugar in the 300s.  He has had other BMPs over the years that have also shown this.  No associated decline in GFR or other electrolyte abnormalities at this time.  ROS: Denies Polyuria,Polydipsia, Denies Hypoglycemia symptoms (palpitations, tremors, anxiousness)     History reviewed. No pertinent surgical history.  No outpatient medications prior to visit.   No facility-administered medications prior to visit.    History reviewed. No pertinent family history.  Social History   Socioeconomic History   Marital status: Married     Spouse name: Not on file   Number of children: Not on file   Years of education: Not on file   Highest education level: Not on file  Occupational History   Not on file  Tobacco Use   Smoking status: Never    Passive exposure: Never   Smokeless tobacco: Never  Vaping Use   Vaping Use: Never used  Substance and Sexual Activity   Alcohol use: Yes    Comment: Rare   Drug use: Yes    Frequency: 2.0 times per week    Types: Marijuana   Sexual activity: Yes  Other Topics Concern   Not on file  Social History Narrative   ** Merged History Encounter **       Social Determinants of Health   Financial Resource Strain: Not on file  Food Insecurity: Not on file  Transportation Needs: Not on file  Physical Activity: Not on file  Stress: Not on file  Social Connections: Not on file  Intimate Partner Violence: Not on file                                                                                                 Objective:  Physical Exam: BP 134/86 (BP Location: Left Arm, Patient Position: Sitting, Cuff Size: Large)   Pulse 87   Temp 97.6 F (36.4 C) (Temporal)   Ht 5' 7.5" (1.715 m)   Wt 264 lb (119.7 kg)   SpO2 98%   BMI 40.74 kg/m    General: No acute distress. Awake and conversant.  Eyes: Normal conjunctiva, anicteric. Round symmetric pupils.  ENT: Hearing grossly intact. No nasal discharge.  Neck: Neck is supple. No masses or thyromegaly.  Respiratory: Respirations are non-labored. No auditory wheezing.  CTA B ABD: Nontender nondistended Skin: Warm. No rashes or ulcers.  Psych: Alert and oriented. Cooperative, depressed affect, tearful, normal judgment.  CV: No cyanosis or JVD, RRR, no MRG MSK: Normal ambulation. No clubbing  Neuro: Sensation and CN II-XII grossly normal.  No tremor       Alesia Banda, MD, MS

## 2022-04-10 NOTE — Assessment & Plan Note (Signed)
Recent random glucose in ED was 300 Patient restart metformin 500 mg twice daily Recommend getting additional restratification labs include hemoglobin A1c and lipid panel

## 2022-04-10 NOTE — Patient Instructions (Signed)
For urgent psychiatric assistance you can go to   [facility] Phone: [phone]  [address] [place], [place] [address]  Hours: Open 24/7, No appointment required.  [place], [place] [address] Phone: [phone]  When you're going through tough times, it's easy to feel lonely and overwhelmed. Remember that YOU ARE NOT ALONE and we at [facility] want to support you during this difficult time.  There are many ways to get help and support when you are ready.  You can call the following help lines: - National suicide hotline 417-306-0142) - [address] (1-800-SUICIDE)   You can schedule an appointment with a team member with your primary care doc or one of our counselors.  We are all here to support you.  A doctor is on call 24/7   There are many treatments that can help people during difficult times, and we can talk with you about medications, counseling, diet, and other options. We want to offer you HOPE that it won't always feel this bad.   If you are seriously thinking about hurting yourself or have a plan, please call 911 or go to any emergency room right away for immediate help.

## 2022-04-10 NOTE — Assessment & Plan Note (Signed)
Add gabapentin nightly to see if this improves insomnia

## 2022-04-10 NOTE — Assessment & Plan Note (Addendum)
Associated with comorbid substance use disorder including alcohol, THC and depression Likely worsening in setting of reduction in alcohol consumption Declined psychiatric and counseling referral Trial gabapentin for anti-anxiolytic effects

## 2022-04-10 NOTE — Assessment & Plan Note (Signed)
Community resources provided Referral to social work for additional resources

## 2022-05-08 ENCOUNTER — Encounter: Payer: Self-pay | Admitting: Family Medicine

## 2022-05-08 ENCOUNTER — Ambulatory Visit (INDEPENDENT_AMBULATORY_CARE_PROVIDER_SITE_OTHER): Payer: Self-pay | Admitting: Family Medicine

## 2022-05-08 VITALS — BP 118/80 | HR 92 | Temp 96.4°F | Ht 67.5 in | Wt 260.2 lb

## 2022-05-08 DIAGNOSIS — E1165 Type 2 diabetes mellitus with hyperglycemia: Secondary | ICD-10-CM

## 2022-05-08 DIAGNOSIS — F1021 Alcohol dependence, in remission: Secondary | ICD-10-CM

## 2022-05-08 DIAGNOSIS — F419 Anxiety disorder, unspecified: Secondary | ICD-10-CM

## 2022-05-08 DIAGNOSIS — F10982 Alcohol use, unspecified with alcohol-induced sleep disorder: Secondary | ICD-10-CM

## 2022-05-08 DIAGNOSIS — F322 Major depressive disorder, single episode, severe without psychotic features: Secondary | ICD-10-CM

## 2022-05-08 DIAGNOSIS — Z599 Problem related to housing and economic circumstances, unspecified: Secondary | ICD-10-CM

## 2022-05-08 MED ORDER — ESCITALOPRAM OXALATE 10 MG PO TABS: ORAL_TABLET | ORAL | 0 refills | Status: DC

## 2022-05-08 MED ORDER — GABAPENTIN 300 MG PO CAPS: 600.0000 mg | ORAL_CAPSULE | Freq: Two times a day (BID) | ORAL | 3 refills | Status: DC

## 2022-05-08 NOTE — Assessment & Plan Note (Signed)
Again encourage patient to take metformin We will discuss labs at next visit after patient has been taking metformin

## 2022-05-08 NOTE — Assessment & Plan Note (Signed)
Patient feels that its improved since gabapentin With comorbid anxiety, alcohol abuse, alcohol withdrawal that is mild Trial escitalopram 5 mg taper up to 10 mg Psychiatric resources provided, encourage patient follow-up

## 2022-05-08 NOTE — Assessment & Plan Note (Addendum)
Symptoms mildly better Still drinking approximately 6 beers a day Comorbid anxiety with possible panic attacks Patient without tremor, vital signs normal, no hallucinations, status post Librium taper no signs of severe withdrawal Was referred to addiction services,.  They tried to reach out to him, however patient denies hearing back from them Information was provided to patient and request that patient follow-up with this Encourage patient to go to [facility] behavioral health for additional resources Discussed limitations of my expertise in acute withdrawal, did discuss the potentially dangerous side effects of withdrawal.  Patient go to emergency department if having severe chest pain, shortness of breath, tremors, hallucinations

## 2022-05-08 NOTE — Progress Notes (Signed)
Assessment/Plan:   Problem List Items Addressed This Visit       Endocrine   Type 2 diabetes mellitus with hyperglycemia, without long-term current use of insulin ([place])    Again encourage patient to take metformin We will discuss labs at next visit after patient has been taking metformin        Other   Alcohol use disorder, severe, in early remission, dependence ([place])    Symptoms mildly better Still drinking approximately 6 beers a day Comorbid anxiety with possible panic attacks Patient without tremor, vital signs normal, no hallucinations, status post Librium taper no signs of severe withdrawal Was referred to addiction services,.  They tried to reach out to him, however patient denies hearing back from them Information was provided to patient and request that patient follow-up with this Encourage patient to go to [facility] behavioral health for additional resources Discussed limitations of my expertise in acute withdrawal, did discuss the potentially dangerous side effects of withdrawal.  Patient go to emergency department if having severe chest pain, shortness of breath, tremors, hallucinations      Relevant Medications   gabapentin (NEURONTIN) 300 MG capsule   Current severe episode of major depressive disorder without psychotic features ([place]) - Primary    Patient feels that its improved since gabapentin With comorbid anxiety, alcohol abuse, alcohol withdrawal that is mild Trial escitalopram 5 mg taper up to 10 mg Psychiatric resources provided, encourage patient follow-up      Relevant Medications   escitalopram (LEXAPRO) 10 MG tablet   gabapentin (NEURONTIN) 300 MG capsule   Other Relevant Orders   Ambulatory referral to Psychiatry   Ambulatory referral to [place]   Severe anxiety    Comorbid depression and alcohol abuse Experiencing some panic attack-like episodes       Relevant Medications   escitalopram (LEXAPRO) 10 MG tablet   gabapentin  (NEURONTIN) 300 MG capsule   Other Relevant Orders   Ambulatory referral to Psychiatry   Ambulatory referral to [place] difficulties   Alcohol-induced insomnia ([place])    Improved on gabapentin          Subjective:  HPI:  Mark Santos is a 36 y.o. male who has Alcohol use disorder, severe, in early remission, dependence ([place]); Current severe episode of major depressive disorder without psychotic features ([place]); Severe anxiety; Financial difficulties; Type 2 diabetes mellitus with hyperglycemia, without long-term current use of insulin ([place]); and Alcohol-induced insomnia (HCC) on their problem list..   He  has a past medical history of Diabetes mellitus without complication ([place]), Passive suicidal ideations (04/10/2022), and Pinched nerve.[name]   He presents with chief complaint of office visit (F/u on anxiety/depression/No Other Concerns) .   Depression/Anxiety, established problem, improved Current Medications: Gabapentin 300 mg in a.m. and 600 mg in p.m. Side Effects:  Current Symptoms/Interim History:  Patient reports no more SI.  Does report that he has had regular panic attacks, nearly every day.  It is predominantly morning in the AM.  Patient just started taking the 300 mg in the gabapentin yesterday.  Reports that he has not had a panic attack today.  Patient was drinking several beers in the morning to help him accommodate this.  He has stopped doing this.       05/08/2022   11:15 AM  Depression screen PHQ 2/9  Decreased Interest 3  Down, Depressed, Hopeless 3  PHQ - 2 Score 6  Altered sleeping 1  Tired, decreased energy 0  Change in appetite 1  Feeling bad or failure about yourself  3  Trouble concentrating 1  Moving slowly or fidgety/restless 3  Suicidal thoughts 0  PHQ-9 Score 15  Difficult doing work/chores Extremely dIfficult       05/08/2022   11:15 AM  GAD 7 : Generalized Anxiety Score  Nervous, Anxious, on Edge 3  Control/stop  worrying 3  Worry too much - different things 3  Trouble relaxing 3  Restless 3  Easily annoyed or irritable 2  Afraid - awful might happen 3  Total GAD 7 Score 20  Anxiety Difficulty Extremely difficult      ROS: No SI or HI.  Alcohol abuse.   Update 05/08/22 Patient reports that he is sleeping better since the gabapentin.  He reports that he is still drinking about 4-6 beers a day.  Reports that cravings have not increased.  Patient states that he is still considering going to "rehab" because he wants to make life changes.  He is still concerned about losing his job as he must provide for his family.  He states that he feels he is failing his family.  Patient reports that drinking and panic attacks are affecting his marriage and relationship with his daughter.  At last visit patient was referred to addiction services.  Appears in chart that they did reach out to the patient, however patient states that he has not heard from them.  Patient has mother-in-law Olegario Messier.  She has retired Engineer, civil (consulting).  He calls her on phone to discuss the problems ongoing.  Olegario Messier describes that patient has actively trying to quit by drinking.  She reports that the panic attacks are significant and affecting relationship.  She states that she had encouraged him to go to [facility] health given his lack of insurance and has encouraged him to pursue counseling.  She is to that she believes that he should be medicated for anxiety and depression in addition alcohol use.  Overall insomnia better.  Patient without tremor, chest pain, shortness of breath.  Occasionally has rapid heart rate but he feels that he is "anxious" this does improve after patient has taken gabapentin and drink some alcohol.   Appears that the referral for addiction services was to Dr. Chalmers Cater, LCAS-S, LPC-S, ED,D [address] [place], [place] [address] [phone]  04/10/2022 Patient has history of significant alcohol  use of at least 12 cans of a day for several years.  Patient had death of a close friend that "died in their sleep" a few years ago.  This incited patient to try to stop drinking.  He states that he was scared he would "fail his family".  Patient was seen in the ED approximately 2 weeks ago for ongoing chest discomfort.  At that time patient had been trying to reduce consumption to to 6 cans/day.  Patient was evaluated in ED for chest pain, it was without ST elevation and negative troponins.  Discharge was felt to be related to alcohol use.  Patient was prescribed a Librium taper, which successfully completed. He also reports significant anxiety and difficulty sleeping as he tried to cut back.  He does deny that he has significant alcohol cravings.  Patient denies any chest pain, shortness of breath seizure-like activity, tremors, or delusions.  Patient states that he is without insurance and cannot afford a inpatient rehab.  Patient does have a friend who has been sober for 8 years.  His friend regularly attends AA  meetings.  Patient has been contemplating this.  DIABETES Type II, established problem, unstable Medications: Patient was prescribed metformin at last visit.  Patient says that he "does not like to take pills" and since we have started him on multiple pills for alcohol use and anxiety.  He has not taken these yet. Interim History-  ROS: Denies Polyuria,Polydipsia, Denies Hypoglycemia symptoms (palpitations, tremors, anxiousness)     History reviewed. No pertinent surgical history.  Outpatient Medications Prior to Visit  Medication Sig Dispense Refill   gabapentin (NEURONTIN) 300 MG capsule Take one capsule at night for 2 weeks. If tolerating well, can increase to 2 pills at night for 2 weeks. If tolerating well, can increase to 1 pill in the morning and continue 2 pills at night. 90 capsule 3   metFORMIN (GLUCOPHAGE) 500 MG tablet Take 1 tablet (500 mg total) by mouth 2 (two) times daily  with a meal. (Patient not taking: Reported on 05/08/2022) 180 tablet 3   ondansetron (ZOFRAN-ODT) 4 MG disintegrating tablet Take 1 tablet (4 mg total) by mouth every 8 (eight) hours as needed for nausea or vomiting. (Patient not taking: Reported on 05/08/2022) 20 tablet 0   benzonatate (TESSALON) 100 MG capsule Take 1 capsule (100 mg total) by mouth every 8 (eight) hours. (Patient not taking: Reported on 05/08/2022) 21 capsule 0   bismuth subsalicylate (PEPTO BISMOL) 262 MG/15ML suspension Take 30 mLs by mouth every 6 (six) hours as needed for indigestion. (Patient not taking: Reported on 05/08/2022)     chlordiazePOXIDE (LIBRIUM) 25 MG capsule 50mg  PO TID x 1D, then 25-50mg  PO BID X 1D, then 25-50mg  PO QD X 1D (Patient not taking: Reported on 05/08/2022) 10 capsule 0   cyclobenzaprine (FLEXERIL) 10 MG tablet Take 1 tablet (10 mg total) by mouth 2 (two) times daily as needed for muscle spasms. (Patient not taking: Reported on 05/08/2022) 20 tablet 0   dicyclomine (BENTYL) 20 MG tablet Take 1 tablet (20 mg total) by mouth 2 (two) times daily. (Patient not taking: Reported on 05/08/2022) 20 tablet 0   famotidine-calcium carbonate-magnesium hydroxide (PEPCID COMPLETE) 10-800-165 MG chewable tablet Chew 1 tablet by mouth daily as needed (nausea, vomiting). (Patient not taking: Reported on 05/08/2022)     ibuprofen (ADVIL) 200 MG tablet Take 200 mg by mouth every 6 (six) hours as needed for moderate pain. (Patient not taking: Reported on 05/08/2022)     ibuprofen (ADVIL) 600 MG tablet Take 1 tablet (600 mg total) by mouth every 6 (six) hours as needed. (Patient not taking: Reported on 05/08/2022) 30 tablet 0   metFORMIN (GLUCOPHAGE) 500 MG tablet Take 1 tablet (500 mg total) by mouth 2 (two) times daily with a meal. (Patient not taking: Reported on 05/08/2022) 60 tablet 2   methocarbamol (ROBAXIN-750) 750 MG tablet Take 1 tablet (750 mg total) by mouth 4 (four) times daily. (Patient not taking: Reported on  05/08/2022) 30 tablet 0   omeprazole (PRILOSEC) 20 MG capsule Take 1 capsule (20 mg total) by mouth daily. (Patient not taking: Reported on 05/08/2022) 60 capsule 0   ondansetron (ZOFRAN ODT) 4 MG disintegrating tablet Take 1 tablet (4 mg total) by mouth every 8 (eight) hours as needed for nausea or vomiting. (Patient not taking: Reported on 05/08/2022) 20 tablet 0   sucralfate (CARAFATE) 1 g tablet Take 1 tablet (1 g total) by mouth 4 (four) times daily -  with meals and at bedtime. (Patient not taking: Reported on 05/08/2022) 60 tablet 1  No facility-administered medications prior to visit.    History reviewed. No pertinent family history.  Social History   Socioeconomic History   Marital status: Married    Spouse name: Not on file   Number of children: Not on file   Years of education: Not on file   Highest education level: Not on file  Occupational History   Not on file  Tobacco Use   Smoking status: Never    Passive exposure: Never   Smokeless tobacco: Never  Vaping Use   Vaping Use: Never used  Substance and Sexual Activity   Alcohol use: Yes    Comment: Rare   Drug use: Yes    Frequency: 2.0 times per week    Types: Marijuana   Sexual activity: Yes  Other Topics Concern   Not on file  Social History Narrative   ** Merged History Encounter **       Social Determinants of Health   Financial Resource Strain: Not on file  Food Insecurity: Not on file  Transportation Needs: Not on file  Physical Activity: Not on file  Stress: Not on file  Social Connections: Not on file  Intimate Partner Violence: Not on file                                                                                                 Objective:  Physical Exam: BP 118/80 (BP Location: Left Arm, Patient Position: Sitting, Cuff Size: Large)   Pulse 92   Temp (!) 96.4 F (35.8 C) (Temporal)   Ht 5' 7.5" (1.715 m)   Wt 260 lb 3.2 oz (118 kg)   SpO2 93%   BMI 40.15 kg/m    General: No  acute distress. Awake and conversant.  Eyes: Normal conjunctiva, anicteric. Round symmetric pupils.  ENT: Hearing grossly intact. No nasal discharge.  Neck: Neck is supple. No masses or thyromegaly.  Respiratory: Respirations are non-labored. No auditory wheezing.  CTA B ABD: Nontender nondistended Skin: Warm. No rashes or ulcers.  Psych: Alert and oriented. Cooperative, depressed affect, tearful, normal judgment.  CV: No cyanosis or JVD, RRR, no MRG MSK: Normal ambulation. No clubbing  Neuro: Sensation and CN II-XII grossly normal.  No tremor       Garner Nash, MD, MS

## 2022-05-08 NOTE — Patient Instructions (Addendum)
Dr. Bethena Roys, LCAS-S, LPC-S, ED,D [address] [place], [place] [address] [phone]  [facility] Phone: [phone] [address] [place], [place] [address] Hours: Open 24/7, No appointment required.  [place] [place], [place] [address] Phone: [phone]

## 2022-05-08 NOTE — Assessment & Plan Note (Signed)
Improved on gabapentin 

## 2022-05-08 NOTE — Assessment & Plan Note (Signed)
Comorbid depression and alcohol abuse Experiencing some panic attack-like episodes

## 2022-05-11 ENCOUNTER — Telehealth: Payer: Self-pay | Admitting: Family Medicine

## 2022-05-11 NOTE — Telephone Encounter (Signed)
FYI:Pt called in stating he has been nauseous for the past three mornings lasting all day. He made the statement "hard-core". I transferred him to nurse triage.

## 2022-05-15 ENCOUNTER — Ambulatory Visit (HOSPITAL_COMMUNITY): Payer: Self-pay | Admitting: Psychiatry

## 2022-05-30 ENCOUNTER — Other Ambulatory Visit: Payer: Self-pay | Admitting: Family Medicine

## 2022-05-30 DIAGNOSIS — F322 Major depressive disorder, single episode, severe without psychotic features: Secondary | ICD-10-CM

## 2022-05-30 DIAGNOSIS — F419 Anxiety disorder, unspecified: Secondary | ICD-10-CM

## 2022-06-05 ENCOUNTER — Encounter: Payer: Self-pay | Admitting: Family Medicine

## 2022-06-05 ENCOUNTER — Ambulatory Visit (INDEPENDENT_AMBULATORY_CARE_PROVIDER_SITE_OTHER): Payer: Self-pay | Admitting: Family Medicine

## 2022-06-05 VITALS — BP 130/84 | HR 88 | Temp 97.6°F | Wt 258.8 lb

## 2022-06-05 DIAGNOSIS — F419 Anxiety disorder, unspecified: Secondary | ICD-10-CM

## 2022-06-05 DIAGNOSIS — F1020 Alcohol dependence, uncomplicated: Secondary | ICD-10-CM

## 2022-06-05 DIAGNOSIS — Z09 Encounter for follow-up examination after completed treatment for conditions other than malignant neoplasm: Secondary | ICD-10-CM

## 2022-06-05 DIAGNOSIS — F322 Major depressive disorder, single episode, severe without psychotic features: Secondary | ICD-10-CM

## 2022-06-05 MED ORDER — HYDROXYZINE HCL 50 MG PO TABS: 50.0000 mg | ORAL_TABLET | Freq: Three times a day (TID) | ORAL | 0 refills | Status: DC | PRN

## 2022-06-05 MED ORDER — GABAPENTIN 800 MG PO TABS: 800.0000 mg | ORAL_TABLET | Freq: Three times a day (TID) | ORAL | 0 refills | Status: DC

## 2022-06-05 MED ORDER — ESCITALOPRAM OXALATE 20 MG PO TABS: 20.0000 mg | ORAL_TABLET | Freq: Every day | ORAL | 0 refills | Status: DC

## 2022-06-05 NOTE — Progress Notes (Signed)
Assessment/Plan:   Problem List Items Addressed This Visit       Other   Alcohol use disorder, severe, dependence (HCC)    Ongoing Has been stable for about 3 weeks.  Strongly encourage patient to continue AA, and establish with psychiatry for which he has been referred to multiple times. Given comorbid severe anxiety, will continue Atarax, increase escitalopram 20 mg, and increase gabapentin to 800 mg 3 times daily Return in about 4 weeks (around 07/03/2022) for anxiety, depression, alcohol use.       Relevant Medications   gabapentin (NEURONTIN) 800 MG tablet   escitalopram (LEXAPRO) 20 MG tablet   hydrOXYzine (ATARAX) 50 MG tablet   Other Relevant Orders   Ambulatory referral to Social Work   Current severe episode of major depressive disorder without psychotic features (HCC)   Relevant Medications   gabapentin (NEURONTIN) 800 MG tablet   escitalopram (LEXAPRO) 20 MG tablet   hydrOXYzine (ATARAX) 50 MG tablet   Other Relevant Orders   Ambulatory referral to Social Work   Severe anxiety - Primary   Relevant Medications   gabapentin (NEURONTIN) 800 MG tablet   escitalopram (LEXAPRO) 20 MG tablet   hydrOXYzine (ATARAX) 50 MG tablet   Other Relevant Orders   Ambulatory referral to Social Work   Other Visit Diagnoses     Hospital discharge follow-up       Relevant Orders   Ambulatory referral to Social Work          Subjective:  HPI:  Mark Santos is a 36 y.o. male who has Alcohol use disorder, severe, dependence (HCC); Current severe episode of major depressive disorder without psychotic features (HCC); Severe anxiety; Financial difficulties; Type 2 diabetes mellitus with hyperglycemia, without long-term current use of insulin (HCC); and Alcohol-induced insomnia (HCC) on their problem list..   He  has a past medical history of Diabetes mellitus without complication (HCC), Passive suicidal ideations (04/10/2022), and Pinched nerve.[name]   He presents with chief  complaint of Follow-up (PHQ/GAD. ) .   Alcohol abuse with comorbid depression/Anxiety,   Regarding alcohol use, patient stated last drink was 05/13/2022.  He went to alcohol detox at [facility] on 05/14/2022.  Patient exhibits significant symptoms of withdrawal requiring hospitalization and ICU care at [facility].  Patient was treated with CIWA protocol and given Librium taper.  He states he has completed Librium taper.  Also started on hydroxyzine for anxiety.  Patient was referred to psychiatry, however he said that he was unable to establish care because he does not have insurance.  He has started a new job recently Current Medications: Gabapentin 600 twice daily, Atarax 25 mg as needed, escitalopram 10 mg.  Patient reports that he is been going to AA.  He is very committed to seeking treatment.  Overall, he feels that his depression is better, but feels like his anxiety has not changed much.  Regarding alcohol, denies any use since hospitalization.  States that the gabapentin is helping with cravings.      06/05/2022   11:11 AM  Depression screen PHQ 2/9  Decreased Interest 1  Down, Depressed, Hopeless 3  PHQ - 2 Score 4  Altered sleeping 1  Tired, decreased energy 1  Change in appetite 1  Feeling bad or failure about yourself  1  Trouble concentrating 1  Moving slowly or fidgety/restless 1  Suicidal thoughts 0  PHQ-9 Score 10  Difficult doing work/chores Somewhat difficult       06/05/2022   11:11  AM  GAD 7 : Generalized Anxiety Score  Nervous, Anxious, on Edge 3  Control/stop worrying 2  Worry too much - different things 2  Trouble relaxing 3  Restless 3  Easily annoyed or irritable 3  Afraid - awful might happen 2  Total GAD 7 Score 18    ROS: No SI or HI.     No past surgical history on file.  Outpatient Medications Prior to Visit  Medication Sig Dispense Refill   escitalopram (LEXAPRO) 10 MG tablet Take 0.5 tablets (5 mg total) by mouth daily for 15 days,  THEN 1 tablet (10 mg total) daily for 15 days. 23 tablet 0   gabapentin (NEURONTIN) 300 MG capsule Take 2 capsules (600 mg total) by mouth 2 (two) times daily. Take one capsule at night for 2 weeks. If tolerating well, can increase to 2 pills at night for 2 weeks. If tolerating well, can increase to 1 pill in the morning and continue 2 pills at night. 90 capsule 3   metFORMIN (GLUCOPHAGE) 500 MG tablet Take 1 tablet (500 mg total) by mouth 2 (two) times daily with a meal. (Patient not taking: Reported on 05/08/2022) 180 tablet 3   ondansetron (ZOFRAN-ODT) 4 MG disintegrating tablet Take 1 tablet (4 mg total) by mouth every 8 (eight) hours as needed for nausea or vomiting. (Patient not taking: Reported on 05/08/2022) 20 tablet 0   No facility-administered medications prior to visit.    No family history on file.  Social History   Socioeconomic History   Marital status: Married    Spouse name: Not on file   Number of children: Not on file   Years of education: Not on file   Highest education level: Not on file  Occupational History   Not on file  Tobacco Use   Smoking status: Never    Passive exposure: Never   Smokeless tobacco: Never  Vaping Use   Vaping Use: Never used  Substance and Sexual Activity   Alcohol use: Yes    Comment: Rare   Drug use: Yes    Frequency: 2.0 times per week    Types: Marijuana   Sexual activity: Yes  Other Topics Concern   Not on file  Social History Narrative   ** Merged History Encounter **       Social Determinants of Health   Financial Resource Strain: Not on file  Food Insecurity: Not on file  Transportation Needs: Not on file  Physical Activity: Not on file  Stress: Not on file  Social Connections: Not on file  Intimate Partner Violence: Not on file                                                                                                 Objective:  Physical Exam: BP 130/84 (BP Location: Left Arm, Patient Position: Sitting,  Cuff Size: Large)   Pulse 88   Temp 97.6 F (36.4 C) (Temporal)   Wt 258 lb 12.8 oz (117.4 kg)   SpO2 99%   BMI 39.94 kg/m    General: No acute distress. Awake and  conversant.  Eyes: Normal conjunctiva, anicteric. Round symmetric pupils.  ENT: Hearing grossly intact. No nasal discharge.  Neck: Neck is supple. No masses or thyromegaly.  Respiratory: Respirations are non-labored.  CTA B ABD: Nontender nondistended Skin: Warm. No rashes or ulcers.  Psych: Alert and oriented. Cooperative, Appropriate mood and affect, Normal judgment.  CV: No cyanosis or JVD, RRR, no MRG MSK: Normal ambulation. No clubbing  Neuro: Sensation and CN II-XII grossly normal, no tremor       Garner Nash, MD, MS

## 2022-06-05 NOTE — Assessment & Plan Note (Signed)
Ongoing Has been stable for about 3 weeks.  Strongly encourage patient to continue AA, and establish with psychiatry for which he has been referred to multiple times. Given comorbid severe anxiety, will continue Atarax, increase escitalopram 20 mg, and increase gabapentin to 800 mg 3 times daily Return in about 4 weeks (around 07/03/2022) for anxiety, depression, alcohol use.

## 2022-06-05 NOTE — Patient Instructions (Addendum)
We are increase escitalopram, gabapentin, and hydroxyzine.  We are referring to Social Work.  Continue to go to AA.   Go to Psychiatry listed below  [facility] Phone: [phone]  [address] [place], [place] [address]  Hours: Open 24/7, No appointment required.

## 2022-06-27 ENCOUNTER — Other Ambulatory Visit: Payer: Self-pay | Admitting: Family Medicine

## 2022-06-27 DIAGNOSIS — F1020 Alcohol dependence, uncomplicated: Secondary | ICD-10-CM

## 2022-06-27 DIAGNOSIS — F419 Anxiety disorder, unspecified: Secondary | ICD-10-CM

## 2022-06-27 DIAGNOSIS — F322 Major depressive disorder, single episode, severe without psychotic features: Secondary | ICD-10-CM

## 2022-07-03 ENCOUNTER — Ambulatory Visit (INDEPENDENT_AMBULATORY_CARE_PROVIDER_SITE_OTHER): Payer: Self-pay | Admitting: Family Medicine

## 2022-07-03 DIAGNOSIS — F1020 Alcohol dependence, uncomplicated: Secondary | ICD-10-CM

## 2022-07-03 DIAGNOSIS — F419 Anxiety disorder, unspecified: Secondary | ICD-10-CM

## 2022-07-03 DIAGNOSIS — F322 Major depressive disorder, single episode, severe without psychotic features: Secondary | ICD-10-CM

## 2022-07-03 MED ORDER — GABAPENTIN 800 MG PO TABS: 1600.0000 mg | ORAL_TABLET | Freq: Three times a day (TID) | ORAL | 0 refills | Status: DC

## 2022-07-03 MED ORDER — ESCITALOPRAM OXALATE 20 MG PO TABS: 20.0000 mg | ORAL_TABLET | Freq: Every day | ORAL | 0 refills | Status: DC

## 2022-07-03 NOTE — Progress Notes (Signed)
Assessment/Plan:   Problem List Items Addressed This Visit       Other   Alcohol use disorder, severe, dependence (HCC)   Relevant Medications   gabapentin (NEURONTIN) 800 MG tablet   escitalopram (LEXAPRO) 20 MG tablet   Current severe episode of major depressive disorder without psychotic features (HCC)   Relevant Medications   gabapentin (NEURONTIN) 800 MG tablet   escitalopram (LEXAPRO) 20 MG tablet   Severe anxiety   Relevant Medications   gabapentin (NEURONTIN) 800 MG tablet   escitalopram (LEXAPRO) 20 MG tablet   Symptoms improved, however anxiety still incompletely controlled Increase gabapentin as prescribed    Subjective:  HPI:  Mark Santos is a 36 y.o. male who has Alcohol use disorder, severe, dependence (HCC); Current severe episode of major depressive disorder without psychotic features (HCC); Severe anxiety; Financial difficulties; Type 2 diabetes mellitus with hyperglycemia, without long-term current use of insulin (HCC); and Alcohol-induced insomnia (HCC) on their problem list..   He  has a past medical history of Diabetes mellitus without complication (HCC), Passive suicidal ideations (04/10/2022), and Pinched nerve.[name]   He presents with chief complaint of Follow-up (PHQ/GAD ) .   Alcohol abuse, still hasn't had a drink, has had an urge, is having dream of drinking  Anxious in the evening Work has been keeping him tired anxiety,  Wakes up,  Work helps Wife had helped and supported in the past, but she is "at end of her rope", but she  Overall  Alcohol abuse with depression/Anxiety, established problem, improved Current Medications: Escitalopram, gabapentin  Current Symptoms/Interim History: Reports that he still remains sober.  Says that he is denies urges to drink, however he is drinking about drinking again.  Reports that his anxiety is improved, this is partly due to going to work.  He reports that symptoms are worse in the evening when he goes  home.  He has a 70-year-old son that "will not listen".  And reports that his wife is "at the end of her rope".     07/03/2022    9:45 AM  Depression screen PHQ 2/9  Decreased Interest 1  Down, Depressed, Hopeless 1  PHQ - 2 Score 2  Altered sleeping 2  Tired, decreased energy 1  Change in appetite 2  Feeling bad or failure about yourself  1  Trouble concentrating 1  Moving slowly or fidgety/restless 1  Suicidal thoughts 0  PHQ-9 Score 10  Difficult doing work/chores Somewhat difficult       07/03/2022    9:45 AM  GAD 7 : Generalized Anxiety Score  Nervous, Anxious, on Edge 2  Control/stop worrying 1  Worry too much - different things 1  Trouble relaxing 2  Restless 1  Easily annoyed or irritable 2  Afraid - awful might happen 1  Total GAD 7 Score 10  Anxiety Difficulty Somewhat difficult    ROS: No SI or HI.   No past surgical history on file.  Outpatient Medications Prior to Visit  Medication Sig Dispense Refill   hydrOXYzine (ATARAX) 50 MG tablet TAKE 1 TABLET BY MOUTH 3 TIMES DAILY AS NEEDED FOR ANXIETY. 270 tablet 1   escitalopram (LEXAPRO) 20 MG tablet Take 1 tablet (20 mg total) by mouth daily. 90 tablet 0   gabapentin (NEURONTIN) 800 MG tablet Take 1 tablet (800 mg total) by mouth 3 (three) times daily. 270 tablet 0   metFORMIN (GLUCOPHAGE) 500 MG tablet Take 1 tablet (500 mg total) by mouth 2 (two)  times daily with a meal. (Patient not taking: Reported on 05/08/2022) 180 tablet 3   No facility-administered medications prior to visit.    No family history on file.  Social History   Socioeconomic History   Marital status: Married    Spouse name: Not on file   Number of children: Not on file   Years of education: Not on file   Highest education level: Not on file  Occupational History   Not on file  Tobacco Use   Smoking status: Never    Passive exposure: Never   Smokeless tobacco: Never  Vaping Use   Vaping Use: Never used  Substance and  Sexual Activity   Alcohol use: Yes    Comment: Rare   Drug use: Yes    Frequency: 2.0 times per week    Types: Marijuana   Sexual activity: Yes  Other Topics Concern   Not on file  Social History Narrative   ** Merged History Encounter **       Social Determinants of Health   Financial Resource Strain: Not on file  Food Insecurity: Not on file  Transportation Needs: Not on file  Physical Activity: Not on file  Stress: Not on file  Social Connections: Not on file  Intimate Partner Violence: Not on file                                                                                                 Objective:  Physical Exam: BP 134/84 (BP Location: Left Arm, Patient Position: Sitting, Cuff Size: Large)   Pulse 70   Temp (!) 97.4 F (36.3 C) (Temporal)   Wt 267 lb 6.4 oz (121.3 kg)   SpO2 99%   BMI 41.26 kg/m    General: No acute distress. Awake and conversant.  Eyes: Normal conjunctiva, anicteric. Round symmetric pupils.  ENT: Hearing grossly intact. No nasal discharge.  Neck: Neck is supple. No masses or thyromegaly.  Respiratory: Respirations are non-labored. No auditory wheezing.  Skin: Warm. No rashes or ulcers.  Psych: Alert and oriented. Cooperative, Appropriate mood and affect, Normal judgment.  CV: No cyanosis or JVD MSK: Normal ambulation. No clubbing  Neuro: Sensation and CN II-XII grossly normal.        Garner Nash, MD, MS

## 2022-07-03 NOTE — Patient Instructions (Signed)
WE are increasing gabapentin to 2 tablets in the morning, 2 tablets in the afternoon, and 1 tablet in the evening. If tolerating this, and insurance will cover it, you can add an extra tablet in the evening if you feel that you need it.

## 2022-10-02 ENCOUNTER — Ambulatory Visit: Payer: Self-pay | Admitting: Family Medicine

## 2022-10-02 ENCOUNTER — Telehealth: Payer: Self-pay | Admitting: Family Medicine

## 2022-10-02 ENCOUNTER — Other Ambulatory Visit: Payer: Self-pay | Admitting: Family Medicine

## 2022-10-02 DIAGNOSIS — F322 Major depressive disorder, single episode, severe without psychotic features: Secondary | ICD-10-CM

## 2022-10-02 DIAGNOSIS — F419 Anxiety disorder, unspecified: Secondary | ICD-10-CM

## 2022-10-02 NOTE — Telephone Encounter (Signed)
Pt was a no show for an OV with Dr Grandville Silos on 10/02/22, I sent a letter.

## 2022-10-03 NOTE — Telephone Encounter (Signed)
1st no show, fee waived, letter sent to reschedule/notify of policy 

## 2023-01-15 ENCOUNTER — Other Ambulatory Visit: Payer: Self-pay | Admitting: Family Medicine

## 2023-01-15 DIAGNOSIS — F322 Major depressive disorder, single episode, severe without psychotic features: Secondary | ICD-10-CM

## 2023-01-15 DIAGNOSIS — F1020 Alcohol dependence, uncomplicated: Secondary | ICD-10-CM

## 2023-01-15 DIAGNOSIS — F419 Anxiety disorder, unspecified: Secondary | ICD-10-CM

## 2023-01-28 IMAGING — DX DG HIP (WITH OR WITHOUT PELVIS) 2-3V*L*
3 series · 3 of 3 positions shown · non-contrast
Comparison: CT abdomen pelvis dated March 28, 2021.

CLINICAL DATA: Left hip pain since fall a week ago.

EXAM:
DG HIP (WITH OR WITHOUT PELVIS) 2-3V LEFT

[pelvis ap]
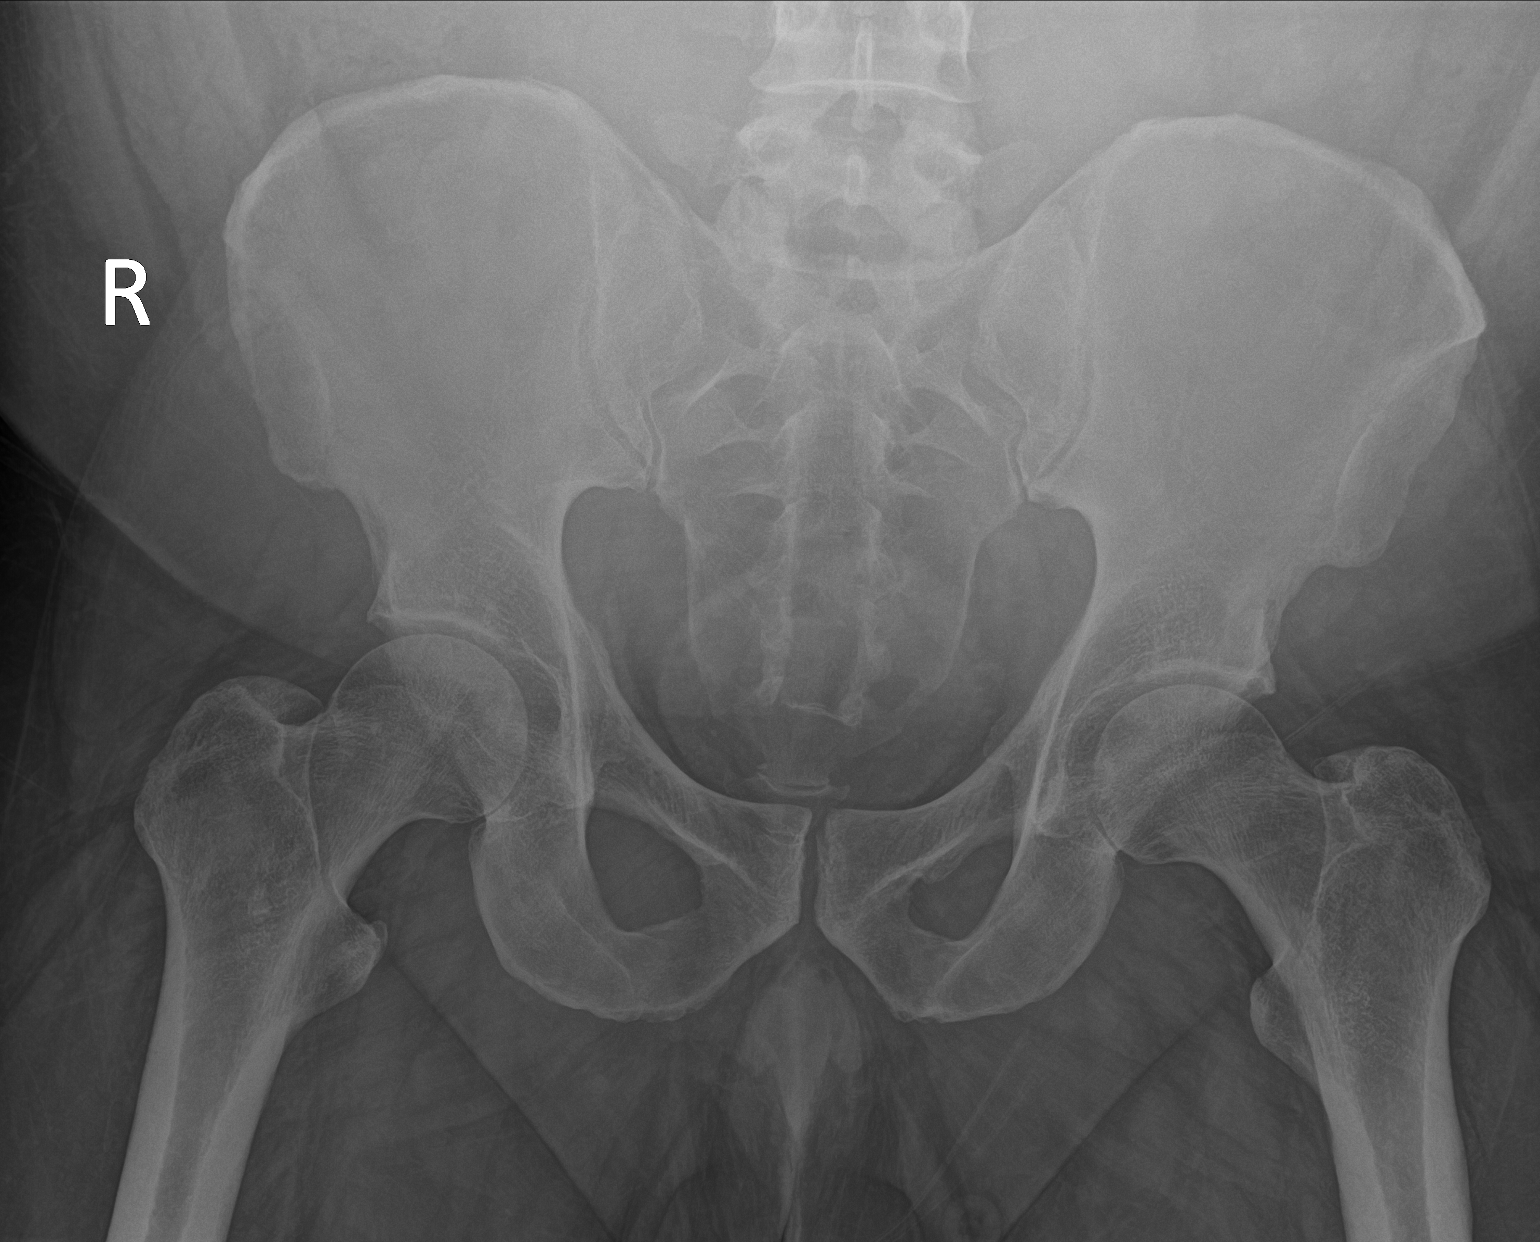

[hip ap]
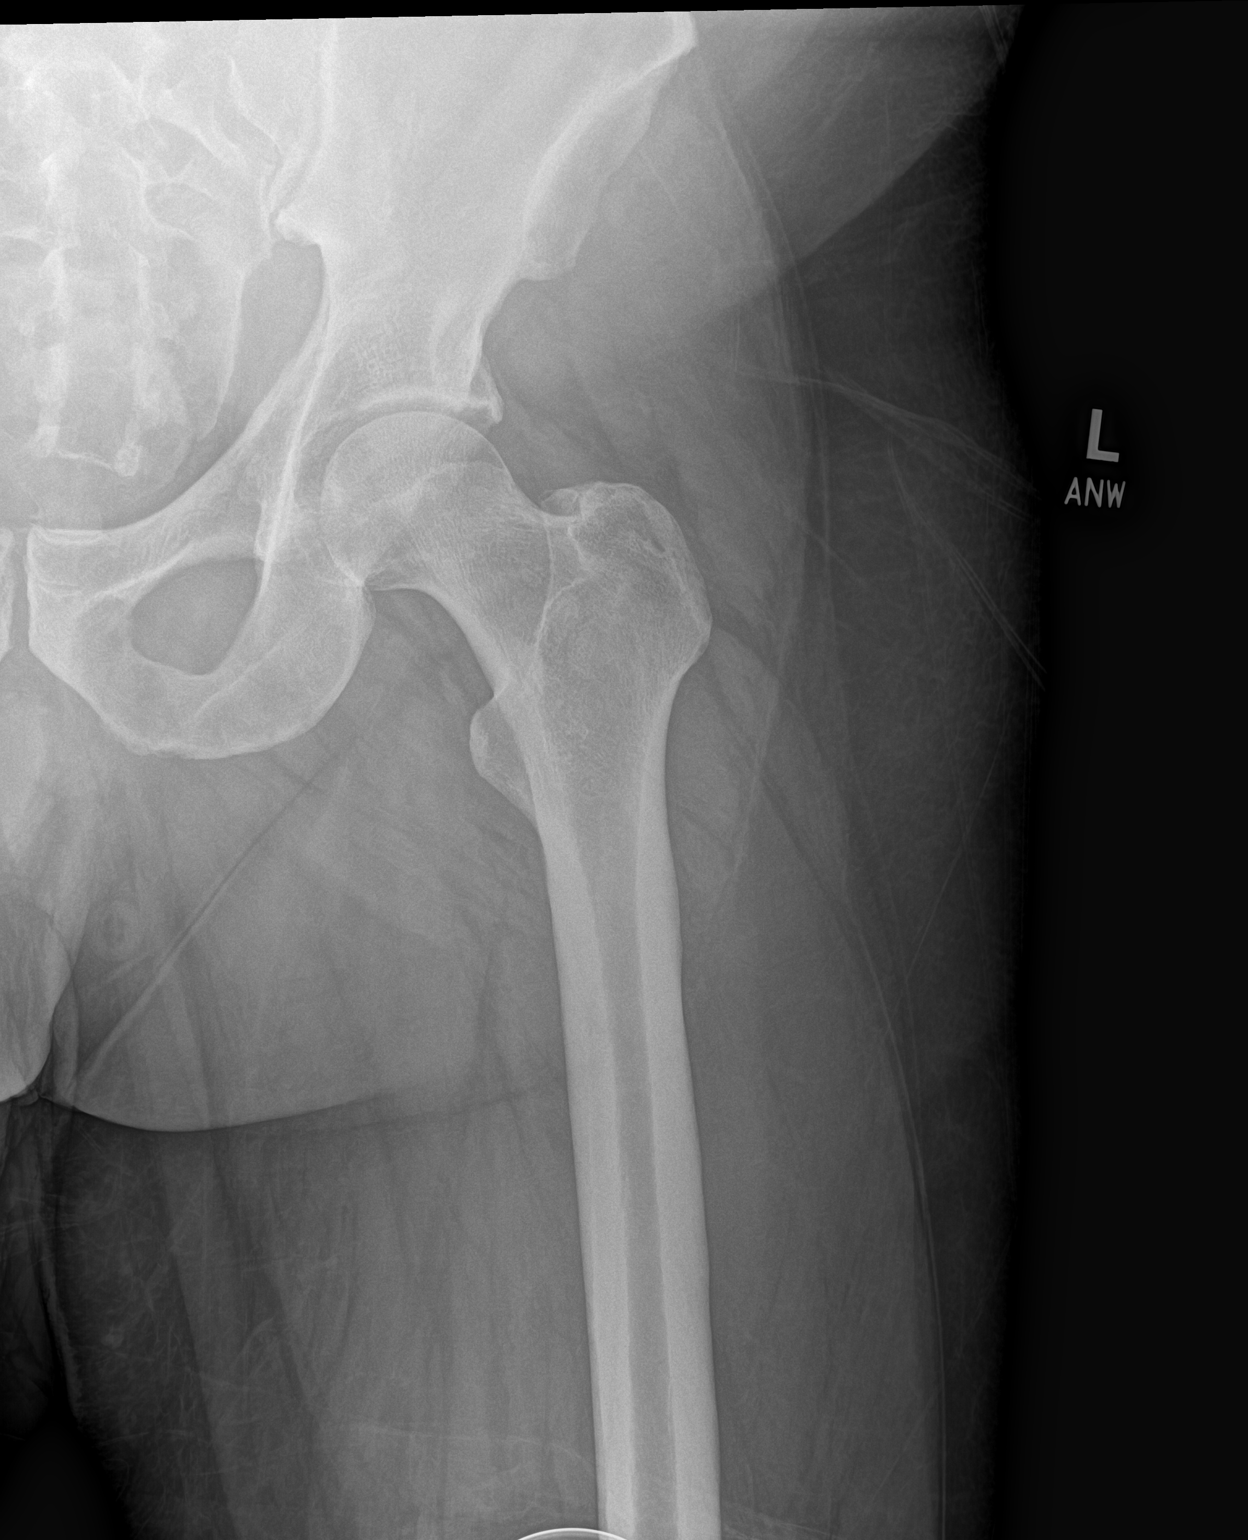

[hip lat]
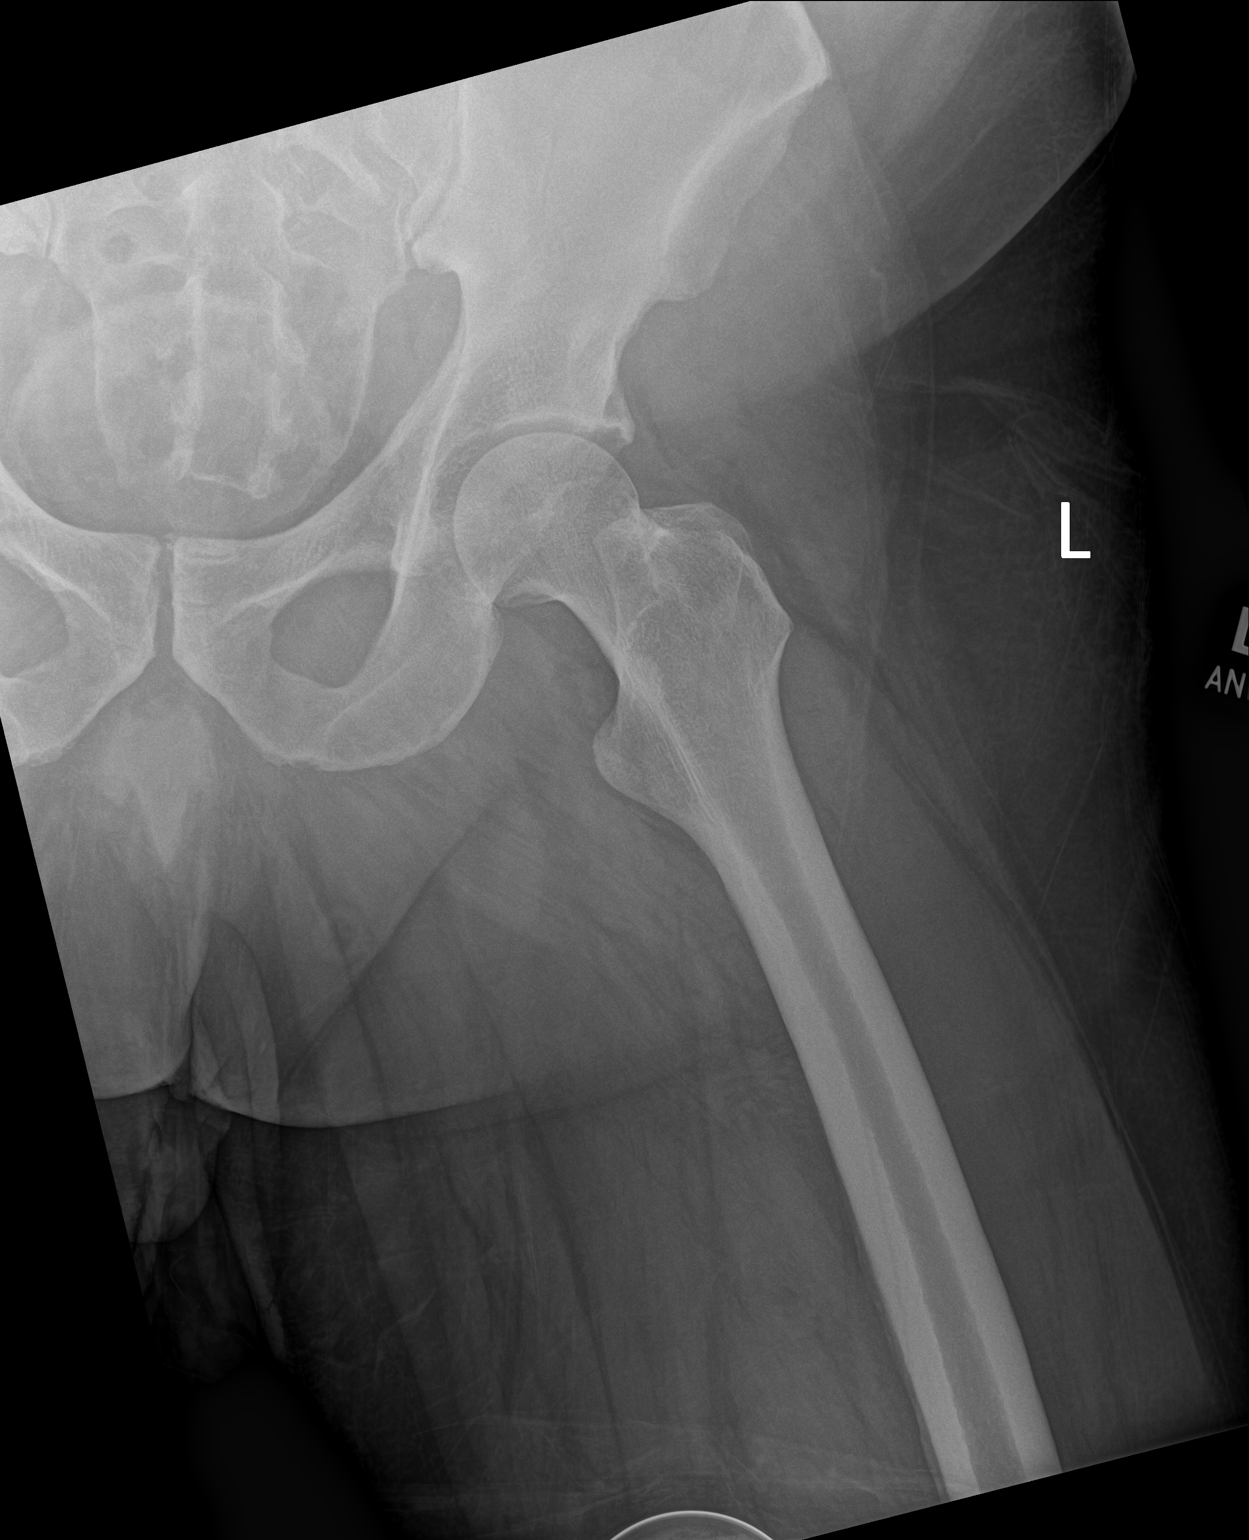

[3 of 3 positions shown; findings below may reference images not displayed]

FINDINGS: There is no evidence of hip fracture or dislocation. There is no
evidence of arthropathy or other focal bone abnormality.
IMPRESSION: Negative.

## 2023-02-19 ENCOUNTER — Other Ambulatory Visit: Payer: Self-pay | Admitting: Family Medicine

## 2023-02-19 DIAGNOSIS — F1020 Alcohol dependence, uncomplicated: Secondary | ICD-10-CM

## 2023-02-19 DIAGNOSIS — F322 Major depressive disorder, single episode, severe without psychotic features: Secondary | ICD-10-CM

## 2023-02-19 DIAGNOSIS — F419 Anxiety disorder, unspecified: Secondary | ICD-10-CM

## 2023-02-20 MED ORDER — GABAPENTIN 800 MG PO TABS
1600.0000 mg | ORAL_TABLET | Freq: Three times a day (TID) | ORAL | 0 refills | Status: DC
Start: 2023-02-20 — End: 2023-10-02

## 2023-02-20 MED ORDER — ESCITALOPRAM OXALATE 20 MG PO TABS: 20.0000 mg | ORAL_TABLET | Freq: Every day | ORAL | 0 refills | Status: DC

## 2023-02-20 NOTE — Telephone Encounter (Signed)
Chart supports rx. Last OV: 07/03/2022 Next OV: 03/01/2023

## 2023-03-01 ENCOUNTER — Ambulatory Visit: Payer: Self-pay | Admitting: Family Medicine

## 2023-03-05 ENCOUNTER — Other Ambulatory Visit: Payer: Self-pay | Admitting: Family Medicine

## 2023-03-05 DIAGNOSIS — F322 Major depressive disorder, single episode, severe without psychotic features: Secondary | ICD-10-CM

## 2023-03-05 DIAGNOSIS — F1020 Alcohol dependence, uncomplicated: Secondary | ICD-10-CM

## 2023-03-05 DIAGNOSIS — F419 Anxiety disorder, unspecified: Secondary | ICD-10-CM

## 2023-03-15 ENCOUNTER — Telehealth: Payer: Self-pay | Admitting: Family Medicine

## 2023-03-15 ENCOUNTER — Encounter: Payer: Self-pay | Admitting: Family Medicine

## 2023-03-15 ENCOUNTER — Ambulatory Visit: Payer: Self-pay | Admitting: Family Medicine

## 2023-03-15 NOTE — Telephone Encounter (Signed)
Pt was a no show for an OV with Dr Janee Morn on 03/15/23, I sent a no show letter.

## 2023-03-15 NOTE — Telephone Encounter (Signed)
2nd no show, final warning letter sent via mail and mychart to reschedule/notify of policy

## 2023-09-27 ENCOUNTER — Other Ambulatory Visit: Payer: Self-pay | Admitting: Family Medicine

## 2023-09-27 DIAGNOSIS — F419 Anxiety disorder, unspecified: Secondary | ICD-10-CM

## 2023-09-27 DIAGNOSIS — F1020 Alcohol dependence, uncomplicated: Secondary | ICD-10-CM

## 2023-09-27 DIAGNOSIS — F322 Major depressive disorder, single episode, severe without psychotic features: Secondary | ICD-10-CM

## 2023-09-27 NOTE — Telephone Encounter (Signed)
 Copied from CRM [id number]. Topic: Clinical - Medication Refill >> Sep 27, 2023 12:18 PM Alphonzo Lemmings O wrote: Most Recent Primary Care Visit:  Provider: Garnette Gunner  Department: [facility]  Visit Type: OFFICE VISIT  Date: 07/03/2022  Medication: gabapentin (NEURONTIN) 800 MG tablet hydrOXYzine (ATARAX) 50 MG tablet    Has the patient contacted their pharmacy? Yes no more refills available  (Agent: If no, request that the patient contact the pharmacy for the refill. If patient does not wish to contact the pharmacy document the reason why and proceed with request.) (Agent: If yes, when and what did the pharmacy advise?)  Is this the correct pharmacy for this prescription? Yes If no, delete pharmacy and type the correct one.  This is the patient's preferred pharmacy:  [id number] [place], [place] - [address] [place] [address] Phone: [phone] Fax: [phone]  [facility] [id number] - [place], [place] - [address] [place] [address] Phone: [phone] Fax: [phone]  [facility] [address] [place] [address] Phone: [phone] Fax: [phone]  [facility] [address] [place], [place] - [address] [place] [address] Phone: [phone] Fax: [phone]   Has the prescription been filled recently? No  Is the patient out of the medication? Yes been out for several weeks been busy with work  Has the patient been seen for an appointment in the last year OR does the patient have an upcoming appointment? No but booking a appointment   Can we respond through MyChart? No  Agent: Please be advised that Rx refills may take up to 3 business days. We ask that you follow-up with your pharmacy.

## 2023-09-28 ENCOUNTER — Other Ambulatory Visit: Payer: Self-pay | Admitting: Family Medicine

## 2023-09-28 DIAGNOSIS — F1020 Alcohol dependence, uncomplicated: Secondary | ICD-10-CM

## 2023-09-28 DIAGNOSIS — F419 Anxiety disorder, unspecified: Secondary | ICD-10-CM

## 2023-09-28 DIAGNOSIS — F322 Major depressive disorder, single episode, severe without psychotic features: Secondary | ICD-10-CM

## 2023-10-02 ENCOUNTER — Ambulatory Visit: Payer: Self-pay | Admitting: Family Medicine

## 2023-10-02 ENCOUNTER — Encounter: Payer: Self-pay | Admitting: Family Medicine

## 2023-10-02 VITALS — BP 132/74 | HR 77 | Temp 98.0°F | Wt 262.8 lb

## 2023-10-02 DIAGNOSIS — Z6841 Body Mass Index (BMI) 40.0 and over, adult: Secondary | ICD-10-CM

## 2023-10-02 DIAGNOSIS — E66813 Obesity, class 3: Secondary | ICD-10-CM | POA: Insufficient documentation

## 2023-10-02 DIAGNOSIS — R140 Abdominal distension (gaseous): Secondary | ICD-10-CM

## 2023-10-02 DIAGNOSIS — F1020 Alcohol dependence, uncomplicated: Secondary | ICD-10-CM

## 2023-10-02 DIAGNOSIS — F419 Anxiety disorder, unspecified: Secondary | ICD-10-CM

## 2023-10-02 DIAGNOSIS — F322 Major depressive disorder, single episode, severe without psychotic features: Secondary | ICD-10-CM

## 2023-10-02 MED ORDER — GABAPENTIN 800 MG PO TABS
1600.0000 mg | ORAL_TABLET | Freq: Three times a day (TID) | ORAL | 11 refills | Status: AC
Start: 2023-10-02 — End: 2024-09-26

## 2023-10-02 MED ORDER — ESCITALOPRAM OXALATE 20 MG PO TABS
20.0000 mg | ORAL_TABLET | Freq: Every day | ORAL | 11 refills | Status: AC
Start: 2023-10-02 — End: 2024-09-26

## 2023-10-02 MED ORDER — HYDROXYZINE HCL 50 MG PO TABS
50.0000 mg | ORAL_TABLET | Freq: Four times a day (QID) | ORAL | 11 refills | Status: DC
Start: 2023-10-02 — End: 2023-10-24

## 2023-10-02 NOTE — Progress Notes (Unsigned)
 Assessment/Plan:   Problem List Items Addressed This Visit       Other   Alcohol use disorder, severe, dependence (HCC)   Current severe episode of major depressive disorder without psychotic features (HCC)   Severe anxiety    There are no discontinued medications.  No follow-ups on file.    Subjective:   Encounter date: 10/02/2023  Mark Santos is a 38 y.o. male who has Alcohol use disorder, severe, dependence (HCC); Current severe episode of major depressive disorder without psychotic features (HCC); Severe anxiety; Financial difficulties; Type 2 diabetes mellitus with hyperglycemia, without long-term current use of insulin (HCC); and Alcohol-induced insomnia (HCC) on their problem list..   He  has a past medical history of Diabetes mellitus without complication (HCC), Passive suicidal ideations (04/10/2022), and Pinched nerve.[name]   He presents with chief complaint of Medical Management of Chronic Issues (Med refills pended. Discuss lexapro) .      07/03/2022    9:45 AM 06/05/2022   11:11 AM 05/08/2022   11:15 AM 04/10/2022   10:23 AM  Depression screen PHQ 2/9  Decreased Interest 1 1 3 3   Down, Depressed, Hopeless 1 3 3 3   PHQ - 2 Score 2 4 6 6   Altered sleeping 2 1 1 3   Tired, decreased energy 1 1 0 3  Change in appetite 2 1 1 3   Feeling bad or failure about yourself  1 1 3 3   Trouble concentrating 1 1 1 3   Moving slowly or fidgety/restless 1 1 3 3   Suicidal thoughts 0 0 0 1  PHQ-9 Score 10 10 15 25   Difficult doing work/chores Somewhat difficult Somewhat difficult Extremely dIfficult Extremely dIfficult      07/03/2022    9:45 AM 06/05/2022   11:11 AM 05/08/2022   11:15 AM 04/10/2022   10:23 AM  GAD 7 : Generalized Anxiety Score  Nervous, Anxious, on Edge 2 3 3 3   Control/stop worrying 1 2 3 3   Worry too much - different things 1 2 3 3   Trouble relaxing 2 3 3 3   Restless 1 3 3 3   Easily annoyed or irritable 2 3 2 3   Afraid - awful might happen 1 2 3  3   Total GAD 7 Score 10 18 20 21   Anxiety Difficulty Somewhat difficult  Extremely difficult Extremely difficult      ROS  No past surgical history on file.  Outpatient Medications Prior to Visit  Medication Sig Dispense Refill   hydrOXYzine (ATARAX) 50 MG tablet TAKE 1 TABLET BY MOUTH 3 TIMES DAILY AS NEEDED FOR ANXIETY. 270 tablet 1   escitalopram (LEXAPRO) 20 MG tablet Take 1 tablet (20 mg total) by mouth daily. 30 tablet 0   gabapentin (NEURONTIN) 800 MG tablet Take 2 tablets (1,600 mg total) by mouth 3 (three) times daily. 180 tablet 0   metFORMIN (GLUCOPHAGE) 500 MG tablet Take 1 tablet (500 mg total) by mouth 2 (two) times daily with a meal. (Patient not taking: Reported on 10/02/2023) 180 tablet 3   No facility-administered medications prior to visit.    No family history on file.  Social History   Socioeconomic History   Marital status: Married    Spouse name: Not on file   Number of children: Not on file   Years of education: Not on file   Highest education level: Not on file  Occupational History   Not on file  Tobacco Use   Smoking status: Never    Passive exposure:  Never   Smokeless tobacco: Never  Vaping Use   Vaping status: Never Used  Substance and Sexual Activity   Alcohol use: Yes    Comment: Rare   Drug use: Yes    Frequency: 2.0 times per week    Types: Marijuana   Sexual activity: Yes  Other Topics Concern   Not on file  Social History Narrative   ** Merged History Encounter **       Social Drivers of Health   Financial Resource Strain: Low Risk  (05/16/2022)   [facility] prior to 09/23/2022[facility] visits prior to 09/23/2022.   Overall Financial Resource Strain (CARDIA)    Difficulty of Paying Living Expenses: Not very hard  Food Insecurity: No Food Insecurity (05/16/2022)   [facility] prior to  09/23/2022., [facility] visits prior to 09/23/2022., [facility]   Hunger Vital Sign    Worried About Running Out of Food in the Last Year: Never true    Ran Out of Food in the Last Year: Never true  Transportation Needs: No Transportation Needs (05/16/2022)   [facility] prior to 09/23/2022., [facility] visits prior to 09/23/2022.   PRAPARE - Administrator, Civil Service (Medical): No    Lack of Transportation (Non-Medical): No  Physical Activity: Inactive (05/16/2022)   [facility] from [facility] visits prior to 09/23/2022., [facility] visits prior to 09/23/2022.   Exercise Vital Sign    Days of Exercise per Week: 0 days    Minutes of Exercise per Session: 30 min  Stress: No Stress Concern Present (05/16/2022)   [facility] prior to 09/23/2022., [facility] visits prior to 09/23/2022.   [employer] of Occupational Health - Occupational Stress Questionnaire    Feeling of Stress : Not at all  Social Connections: Moderately Isolated (05/16/2022)   [facility] prior to 09/23/2022., [facility] visits prior to 09/23/2022.   Social Connection and Isolation Panel [NHANES]    Frequency of Communication with Friends and Family: Three times a week    Frequency of Social Gatherings with Friends and Family: Not on file    Attends Religious Services: Never    Active Member of Clubs or Organizations: No    Attends Banker Meetings: Never    Marital Status: Married  Catering manager Violence: Not At Risk (05/16/2022)   [facility] prior to 09/23/2022.,  [facility] visits prior to 09/23/2022.   Humiliation, Afraid, Rape, and Kick questionnaire    Fear of Current or Ex-Partner: No    Emotionally Abused: No    Physically Abused: No    Sexually Abused: No  Objective:  Physical Exam: BP 132/74   Pulse 77   Temp 98 F (36.7 C) (Temporal)   Wt 262 lb 12.8 oz (119.2 kg)   SpO2 97%   BMI 40.55 kg/m    Wt Readings from Last 3 Encounters:  10/02/23 262 lb 12.8 oz (119.2 kg)  07/03/22 267 lb 6.4 oz (121.3 kg)  06/05/22 258 lb 12.8 oz (117.4 kg)     Physical Exam  No results found.  No results found for this or any previous visit (from the past 2160 hours).      Garner Nash, MD, MS

## 2023-10-05 ENCOUNTER — Encounter: Payer: Self-pay | Admitting: Family Medicine

## 2023-10-24 ENCOUNTER — Other Ambulatory Visit: Payer: Self-pay | Admitting: Family Medicine

## 2023-10-24 DIAGNOSIS — F322 Major depressive disorder, single episode, severe without psychotic features: Secondary | ICD-10-CM

## 2023-10-24 DIAGNOSIS — F1020 Alcohol dependence, uncomplicated: Secondary | ICD-10-CM

## 2023-10-24 DIAGNOSIS — F419 Anxiety disorder, unspecified: Secondary | ICD-10-CM

## 2023-11-02 ENCOUNTER — Ambulatory Visit (INDEPENDENT_AMBULATORY_CARE_PROVIDER_SITE_OTHER): Payer: Self-pay | Admitting: Family Medicine

## 2023-11-02 ENCOUNTER — Encounter: Payer: Self-pay | Admitting: Family Medicine

## 2023-11-02 VITALS — BP 128/84 | HR 72 | Temp 97.6°F | Wt 263.8 lb

## 2023-11-02 DIAGNOSIS — F1020 Alcohol dependence, uncomplicated: Secondary | ICD-10-CM

## 2023-11-02 DIAGNOSIS — E119 Type 2 diabetes mellitus without complications: Secondary | ICD-10-CM

## 2023-11-02 DIAGNOSIS — R140 Abdominal distension (gaseous): Secondary | ICD-10-CM

## 2023-11-02 DIAGNOSIS — K760 Fatty (change of) liver, not elsewhere classified: Secondary | ICD-10-CM

## 2023-11-02 DIAGNOSIS — F332 Major depressive disorder, recurrent severe without psychotic features: Secondary | ICD-10-CM

## 2023-11-02 DIAGNOSIS — Z0000 Encounter for general adult medical examination without abnormal findings: Secondary | ICD-10-CM

## 2023-11-02 DIAGNOSIS — Z6841 Body Mass Index (BMI) 40.0 and over, adult: Secondary | ICD-10-CM

## 2023-11-02 DIAGNOSIS — E66813 Obesity, class 3: Secondary | ICD-10-CM

## 2023-11-02 DIAGNOSIS — F10982 Alcohol use, unspecified with alcohol-induced sleep disorder: Secondary | ICD-10-CM

## 2023-11-02 DIAGNOSIS — F419 Anxiety disorder, unspecified: Secondary | ICD-10-CM

## 2023-11-02 DIAGNOSIS — Z1159 Encounter for screening for other viral diseases: Secondary | ICD-10-CM

## 2023-11-02 DIAGNOSIS — E1165 Type 2 diabetes mellitus with hyperglycemia: Secondary | ICD-10-CM

## 2023-11-02 LAB — LIPID PANEL
Cholesterol: 195 mg/dL (ref 0–200)
HDL: 44.2 mg/dL (ref >39.00)
LDL Cholesterol: 132 mg/dL — ABNORMAL HIGH (ref 0–99)
NonHDL: 151.13
Total CHOL/HDL Ratio: 4
Triglycerides: 97 mg/dL (ref 0.0–149.0)
VLDL: 19.4 mg/dL (ref 0.0–40.0)

## 2023-11-02 LAB — MICROALBUMIN / CREATININE URINE RATIO
Creatinine,U: 173 mg/dL
Microalb Creat Ratio: UNDETERMINED mg/g (ref 0.0–30.0)
Microalb, Ur: <0.7 mg/dL

## 2023-11-02 LAB — POCT GLYCOSYLATED HEMOGLOBIN (HGB A1C): Hemoglobin A1C: 5.6 % (ref 4.0–5.6)

## 2023-11-02 LAB — CBC WITH DIFFERENTIAL/PLATELET
Basophils Absolute: 0.1 10*3/uL (ref 0.0–0.1)
Basophils Relative: 0.9 % (ref 0.0–3.0)
Eosinophils Absolute: 0.2 10*3/uL (ref 0.0–0.7)
Eosinophils Relative: 2.3 % (ref 0.0–5.0)
HCT: 44 % (ref 39.0–52.0)
Hemoglobin: 14.9 g/dL (ref 13.0–17.0)
Lymphocytes Relative: 33.1 % (ref 12.0–46.0)
Lymphs Abs: 2.2 10*3/uL (ref 0.7–4.0)
MCHC: 33.8 g/dL (ref 30.0–36.0)
MCV: 84.1 fl (ref 78.0–100.0)
Monocytes Absolute: 0.5 10*3/uL (ref 0.1–1.0)
Monocytes Relative: 8 % (ref 3.0–12.0)
Neutro Abs: 3.7 10*3/uL (ref 1.4–7.7)
Neutrophils Relative %: 55.7 % (ref 43.0–77.0)
Platelets: 170 10*3/uL (ref 150.0–400.0)
RBC: 5.23 Mil/uL (ref 4.22–5.81)
RDW: 13.8 % (ref 11.5–15.5)
WBC: 6.6 10*3/uL (ref 4.0–10.5)

## 2023-11-02 LAB — COMPREHENSIVE METABOLIC PANEL WITH GFR
ALT: 14 U/L (ref 0–53)
AST: 21 U/L (ref 0–37)
Albumin: 4.8 g/dL (ref 3.5–5.2)
Alkaline Phosphatase: 74 U/L (ref 39–117)
BUN: 11 mg/dL (ref 6–23)
CO2: 30 meq/L (ref 19–32)
Calcium: 9.5 mg/dL (ref 8.4–10.5)
Chloride: 102 meq/L (ref 96–112)
Creatinine, Ser: 0.94 mg/dL (ref 0.40–1.50)
GFR: 101.94 mL/min (ref >60.00)
Glucose, Bld: 87 mg/dL (ref 70–99)
Potassium: 4.2 meq/L (ref 3.5–5.1)
Sodium: 139 meq/L (ref 135–145)
Total Bilirubin: 1.6 mg/dL — ABNORMAL HIGH (ref 0.2–1.2)
Total Protein: 7.6 g/dL (ref 6.0–8.3)

## 2023-11-02 LAB — URINALYSIS, ROUTINE W REFLEX MICROSCOPIC
Bilirubin Urine: NEGATIVE
Hgb urine dipstick: NEGATIVE
Ketones, ur: NEGATIVE
Leukocytes,Ua: NEGATIVE
Nitrite: NEGATIVE
RBC / HPF: NONE SEEN (ref 0–?)
Specific Gravity, Urine: 1.015 (ref 1.000–1.030)
Total Protein, Urine: NEGATIVE
Urine Glucose: NEGATIVE
Urobilinogen, UA: 1 (ref 0.0–1.0)
pH: 8 (ref 5.0–8.0)

## 2023-11-02 LAB — TSH: TSH: 1.43 u[IU]/mL (ref 0.35–5.50)

## 2023-11-02 NOTE — Patient Instructions (Signed)
 Health Maintenance, Male  Adopting a healthy lifestyle and getting preventive care are important in promoting health and wellness. Ask your health care provider about:  The right schedule for you to have regular tests and exams.  Things you can do on your own to prevent diseases and keep yourself healthy.  What should I know about diet, weight, and exercise?  Eat a healthy diet    Eat a diet that includes plenty of vegetables, fruits, low-fat dairy products, and lean protein.  Do not eat a lot of foods that are high in solid fats, added sugars, or sodium.  Maintain a healthy weight  Body mass index (BMI) is a measurement that can be used to identify possible weight problems. It estimates body fat based on height and weight. Your health care provider can help determine your BMI and help you achieve or maintain a healthy weight.  Get regular exercise  Get regular exercise. This is one of the most important things you can do for your health. Most adults should:  Exercise for at least 150 minutes each week. The exercise should increase your heart rate and make you sweat (moderate-intensity exercise).  Do strengthening exercises at least twice a week. This is in addition to the moderate-intensity exercise.  Spend less time sitting. Even light physical activity can be beneficial.  Watch cholesterol and blood lipids  Have your blood tested for lipids and cholesterol at 40 years of age, then have this test every 5 years.  You may need to have your cholesterol levels checked more often if:  Your lipid or cholesterol levels are high.  You are older than 40 years of age.  You are at high risk for heart disease.  What should I know about cancer screening?  Many types of cancers can be detected early and may often be prevented. Depending on your health history and family history, you may need to have cancer screening at various ages. This may include screening for:  Colorectal cancer.  Prostate cancer.  Skin cancer.  Lung  cancer.  What should I know about heart disease, diabetes, and high blood pressure?  Blood pressure and heart disease  High blood pressure causes heart disease and increases the risk of stroke. This is more likely to develop in people who have high blood pressure readings or are overweight.  Talk with your health care provider about your target blood pressure readings.  Have your blood pressure checked:  Every 3-5 years if you are 9-95 years of age.  Every year if you are 85 years old or older.  If you are between the ages of 29 and 29 and are a current or former smoker, ask your health care provider if you should have a one-time screening for abdominal aortic aneurysm (AAA).  Diabetes  Have regular diabetes screenings. This checks your fasting blood sugar level. Have the screening done:  Once every three years after age 23 if you are at a normal weight and have a low risk for diabetes.  More often and at a younger age if you are overweight or have a high risk for diabetes.  What should I know about preventing infection?  Hepatitis B  If you have a higher risk for hepatitis B, you should be screened for this virus. Talk with your health care provider to find out if you are at risk for hepatitis B infection.  Hepatitis C  Blood testing is recommended for:  Everyone born from [birth year] through 1967.  Anyone  with known risk factors for hepatitis C.  Sexually transmitted infections (STIs)  You should be screened each year for STIs, including gonorrhea and chlamydia, if:  You are sexually active and are younger than 40 years of age.  You are older than 40 years of age and your health care provider tells you that you are at risk for this type of infection.  Your sexual activity has changed since you were last screened, and you are at increased risk for chlamydia or gonorrhea. Ask your health care provider if you are at risk.  Ask your health care provider about whether you are at high risk for HIV. Your health care provider  may recommend a prescription medicine to help prevent HIV infection. If you choose to take medicine to prevent HIV, you should first get tested for HIV. You should then be tested every 3 months for as long as you are taking the medicine.  Follow these instructions at home:  Alcohol use  Do not drink alcohol if your health care provider tells you not to drink.  If you drink alcohol:  Limit how much you have to 0-2 drinks a day.  Know how much alcohol is in your drink. In the [place] one drink equals one 12 oz bottle of beer (355 mL), one 5 oz glass of wine (148 mL), or one 1 oz glass of hard liquor (44 mL).  Lifestyle  Do not use any products that contain nicotine or tobacco. These products include cigarettes, chewing tobacco, and vaping devices, such as e-cigarettes. If you need help quitting, ask your health care provider.  Do not use street drugs.  Do not share needles.  Ask your health care provider for help if you need support or information about quitting drugs.  General instructions  Schedule regular health, dental, and eye exams.  Stay current with your vaccines.  Tell your health care provider if:  You often feel depressed.  You have ever been abused or do not feel safe at home.  Summary  Adopting a healthy lifestyle and getting preventive care are important in promoting health and wellness.  Follow your health care provider's instructions about healthy diet, exercising, and getting tested or screened for diseases.  Follow your health care provider's instructions on monitoring your cholesterol and blood pressure.  This information is not intended to replace advice given to you by your health care provider. Make sure you discuss any questions you have with your health care provider.  Document Revised: 11/29/2020 Document Reviewed: 11/29/2020  Elsevier Patient Education  2024 [employer].

## 2023-11-02 NOTE — Progress Notes (Signed)
 Assessment/Plan:   Assessment & Plan Anxiety Disorder Anxiety symptoms are manageable with current medication regimen, primarily affecting the stomach and chest. Gabapentin aids in managing anxiety and alcohol cravings. Hydroxyzine is used as needed for acute anxiety relief. Buspirone is considered as an alternative to hydroxyzine, potentially aiding anxiety and alcohol cravings by affecting serotonin receptors differently than escitalopram. - Continue escitalopram 20 mg daily - Continue gabapentin 800 mg twice daily - Continue hydroxyzine as needed for acute anxiety - Consider buspirone if anxiety and alcohol cravings become less manageable  Alcohol Use Disorder Alcohol cravings are reduced and manageable with gabapentin. Reduction in alcohol use has positively impacted blood sugar levels and overall health. Buspirone is considered as an alternative to hydroxyzine, potentially aiding anxiety and alcohol cravings by affecting serotonin receptors differently. - Continue gabapentin 800 mg twice daily to help manage cravings - Monitor alcohol cravings and consider buspirone if needed  Bloating and Abdominal Discomfort Bloating occurs primarily in the upper abdomen, exacerbated by certain foods like sugar, and improves with a bland diet. A previous CT scan showed fatty liver, but no acute issues. Alcohol use may contribute to gastrointestinal symptoms, with the possibility of alcoholic gastritis considered. Simethicone is suggested to reduce bloating. Referral to gastroenterology for further evaluation, including possible endoscopy, is recommended to assess for potential changes in the stomach lining due to alcohol use. - Try simethicone (Gas-X) with meals to reduce bloating - Consider a low FODMAP diet to identify and avoid gas-producing foods - Refer to gastroenterology for further evaluation, including possible endoscopy  Metabolic Dysfunction-Associated Steatotic Liver Disease (MASLD) Fatty  liver identified on CT scan, likely related to alcohol use and dietary habits. No current symptoms of liver pain, but monitoring is necessary to prevent progression. - Order lab work to assess liver function, including liver enzymes and metabolic panel  Carpal Tunnel Syndrome Numbness in the hands, particularly when using a phone for prolonged periods, consistent with carpal tunnel syndrome. - Consider wrist braces if symptoms worsen - Monitor symptoms and consider further evaluation if necessary  General Health Maintenance A1c is excellent, indicating resolved blood sugar issues. Lab work is ordered to check kidney function, electrolytes, thyroid, and cholesterol. Hepatitis C screening is recommended. Regular eye exams are advised. - Order lab work to check kidney function, electrolytes, thyroid, and cholesterol - Recommend hepatitis C screening - Advise on regular eye exams  Follow-up Return for follow-up to monitor ongoing health conditions and response to current treatment plans. - Schedule follow-up appointment in 3 months to reassess mood and lab results      There are no discontinued medications.  Return in 1 year (on 11/01/2024).    Subjective:   Encounter date: 11/02/2023  Mark Santos is a 38 y.o. male who has Alcohol use disorder, severe, dependence (HCC); Current severe episode of major depressive disorder without psychotic features (HCC); Severe anxiety; Financial difficulties; Type 2 diabetes mellitus with hyperglycemia, without long-term current use of insulin (HCC); Alcohol-induced insomnia (HCC); Class 3 severe obesity due to excess calories with body mass index (BMI) of 40.0 to 44.9 in adult [facility]); and Bloating on their problem list..   He  has a past medical history of Diabetes mellitus without complication (HCC), Passive suicidal ideations (04/10/2022), and Pinched nerve.Mark Santos   He presents with chief complaint of Annual Exam (Fasting) .   Discussed the use of  AI scribe software for clinical note transcription with the patient, who gave verbal consent to proceed.  History of Present Illness  Mark Santos is a 38 year old male who presents for a follow-up visit to review lab work and manage anxiety.  He experiences ongoing anxiety, which fluctuates between manageable and intense episodes. The anxiety primarily manifests as a persistent 'gut feeling' in the stomach and chest, disrupting daily functioning. No suicidal thoughts, but he has experienced passive suicidal ideation in the past. He is currently taking citalopram 20 mg, gabapentin 800 mg twice daily, and hydroxyzine twice daily. Gabapentin is particularly helpful for both anxiety and alcohol cravings, though he sometimes forgets the afternoon dose. Hydroxyzine helps with anxiety, but he is uncertain about its overall effectiveness.  He experiences bloating in the upper abdomen, particularly after consuming sugar, and finds relief with a bland diet. A CT scan from 2023 identified fatty liver, but no significant issues were noted. He denies consuming large amounts of food, typically eating a honey bun in the morning and a sandwich at night. He has a history of alcohol use, which he believes has affected his stomach, leading to symptoms similar to lactose intolerance. No current alcohol consumption, but he acknowledges moments of craving. He has not had a gastrointestinal scope but recalls it being discussed previously.  He reports numbness in his hands, particularly when using his phone for extended periods, which he associates with carpal tunnel syndrome. No numbness in the feet. No trouble swallowing or pain in the liver area.     11/02/2023    9:40 AM 10/02/2023    2:19 PM 07/03/2022    9:45 AM 06/05/2022   11:11 AM 05/08/2022   11:15 AM  Depression screen PHQ 2/9  Decreased Interest 1 2 1 1 3   Down, Depressed, Hopeless 1 1 1 3 3   PHQ - 2 Score 2 3 2 4 6   Altered sleeping 2 3 2 1 1   Tired,  decreased energy 1 1 1 1  0  Change in appetite 1 1 2 1 1   Feeling bad or failure about yourself  2 1 1 1 3   Trouble concentrating 2 1 1 1 1   Moving slowly or fidgety/restless 1 1 1 1 3   Suicidal thoughts 0 1 0 0 0  PHQ-9 Score 11 12 10 10 15   Difficult doing work/chores Somewhat difficult Very difficult Somewhat difficult Somewhat difficult Extremely dIfficult      11/02/2023    9:40 AM 10/02/2023    2:20 PM 07/03/2022    9:45 AM 06/05/2022   11:11 AM  GAD 7 : Generalized Anxiety Score  Nervous, Anxious, on Edge 2 2 2 3   Control/stop worrying 1 3 1 2   Worry too much - different things 2 3 1 2   Trouble relaxing 2 3 2 3   Restless 1 3 1 3   Easily annoyed or irritable 1 2 2 3   Afraid - awful might happen 2 3 1 2   Total GAD 7 Score 11 19 10 18   Anxiety Difficulty Somewhat difficult Very difficult Somewhat difficult        ROS  History reviewed. No pertinent surgical history.  Outpatient Medications Prior to Visit  Medication Sig Dispense Refill   escitalopram (LEXAPRO) 20 MG tablet Take 1 tablet (20 mg total) by mouth daily. 30 tablet 11   gabapentin (NEURONTIN) 800 MG tablet Take 2 tablets (1,600 mg total) by mouth 3 (three) times daily. 180 tablet 11   hydrOXYzine (ATARAX) 50 MG tablet TAKE 1 TABLET BY MOUTH 4 TIMES DAILY. 360 tablet 4   No facility-administered medications prior to visit.  History reviewed. No pertinent family history.  Social History   Socioeconomic History   Marital status: Married    Spouse name: Not on file   Number of children: Not on file   Years of education: Not on file   Highest education level: Not on file  Occupational History   Not on file  Tobacco Use   Smoking status: Never    Passive exposure: Never   Smokeless tobacco: Never  Vaping Use   Vaping status: Never Used  Substance and Sexual Activity   Alcohol use: Not Currently   Drug use: Yes    Frequency: 2.0 times per week    Types: Marijuana   Sexual activity: Yes  Other  Topics Concern   Not on file  Social History Narrative   ** Merged History Encounter **       Social Drivers of Health   Financial Resource Strain: Low Risk  (05/16/2022)   [facility] to 09[facility]23/2022[facility] visits prior to 09/23/2022.   Overall Financial Resource Strain (CARDIA)    Difficulty of Paying Living Expenses: Not very hard  Food Insecurity: No Food Insecurity (05/16/2022)   [facility] prior to 09/23/2022[facility] visits prior to 09/23/2022., [facility]   Hunger Vital Sign    Worried About Running Out of Food in the Last Year: Never true    Ran Out of Food in the Last Year: Never true  Transportation Needs: No Transportation Needs (05/16/2022)   [facility] visits prior to 09/23/2022., [facility] visits prior to 09/23/2022.   PRAPARE - Administrator, Civil Service (Medical): No    Lack of Transportation (Non-Medical): No  Physical Activity: Inactive (05/16/2022)   [facility] from [facility] visits prior to 09/23/2022., [facility] visits prior to 09/23/2022.   Exercise Vital Sign    Days of Exercise per Week: 0 days    Minutes of Exercise per Session: 30 min  Stress: No Stress Concern Present (05/16/2022)   [facility] prior to 09/23/2022., [facility] visits prior to 09/23/2022.   [employer] of Occupational Health - Occupational Stress Questionnaire    Feeling of Stress : Not at all  Social Connections: Moderately Isolated (05/16/2022)   [facility] prior to 09/23/2022[facility] visits prior to 09/23/2022.   Social Connection and Isolation Panel [NHANES]    Frequency of Communication with Friends and Family: Three times a week    Frequency of Social Gatherings with Friends and Family: Not on file    Attends Religious Services: Never    Active Member of Clubs or Organizations: No    Attends Banker Meetings: Never    Marital Status: Married  Catering manager Violence: Not At Risk (05/16/2022)   [facility] prior to 09/23/2022., [facility] visits prior to 09/23/2022.   Humiliation, Afraid, Rape, and Kick questionnaire    Fear of Current or Ex-Partner: No    Emotionally Abused: No    Physically Abused: No    Sexually Abused: No  Objective:  Physical Exam: BP 128/84   Pulse 72   Temp 97.6 F (36.4 C) (Temporal)   Wt 263 lb 12.8 oz (119.7 kg)   SpO2 97%   BMI 40.71 kg/m    Physical Exam GENERAL: Alert, cooperative, well developed, no acute distress. HEENT: Normocephalic, normal oropharynx, moist mucous membranes. NECK: Thyroid normal on palpation. CHEST: Clear to auscultation bilaterally, no wheezes, rhonchi, or crackles. CARDIOVASCULAR: Normal heart rate and rhythm, S1 and S2 normal without murmurs. ABDOMEN: Soft, non-tender, non-distended, without organomegaly, normal bowel sounds. EXTREMITIES: No cyanosis or edema, no peripheral neuropathy in feet. NEUROLOGICAL: Cranial nerves grossly intact, moves all extremities without gross motor or sensory deficit, sensation intact in feet.  Diabetic foot exam was performed with the following findings:   No deformities, ulcerations, or other skin breakdown Normal sensation of 10g monofilament Intact posterior tibialis and dorsalis pedis pulses        No results found.  Recent Results  (from the past 2160 hours)  POCT glycosylated hemoglobin (Hb A1C)     Status: None   Collection Time: 11/02/23  9:38 AM  Result Value Ref Range   Hemoglobin A1C 5.6 4.0 - 5.6 %   HbA1c POC (<> result, manual entry)     HbA1c, POC (prediabetic range)     HbA1c, POC (controlled diabetic range)          Carnell Christian, MD, MS

## 2023-11-07 ENCOUNTER — Encounter: Payer: Self-pay | Admitting: Family Medicine

## 2023-11-07 LAB — HIV ANTIBODY (ROUTINE TESTING W REFLEX): HIV 1&2 Ab, 4th Generation: NONREACTIVE

## 2023-11-07 LAB — HEPATITIS C ANTIBODY: Hepatitis C Ab: NONREACTIVE

## 2023-11-07 LAB — VITAMIN D 1,25 DIHYDROXY
Vitamin D 1, 25 (OH)2 Total: 34 pg/mL (ref 18–72)
Vitamin D2 1, 25 (OH)2: <8 pg/mL
Vitamin D3 1, 25 (OH)2: 34 pg/mL

## 2023-12-21 ENCOUNTER — Encounter: Payer: Self-pay | Admitting: Family Medicine

## 2024-02-04 ENCOUNTER — Ambulatory Visit: Payer: Self-pay | Admitting: Family Medicine

## 2024-05-11 ENCOUNTER — Other Ambulatory Visit: Payer: Self-pay | Admitting: Family Medicine

## 2024-05-11 DIAGNOSIS — F1020 Alcohol dependence, uncomplicated: Secondary | ICD-10-CM

## 2024-05-11 DIAGNOSIS — F419 Anxiety disorder, unspecified: Secondary | ICD-10-CM

## 2024-05-11 DIAGNOSIS — F322 Major depressive disorder, single episode, severe without psychotic features: Secondary | ICD-10-CM
